# Patient Record
Sex: Male | Born: 1965 | Race: Asian | Hispanic: No | Smoking: Former smoker
Health system: Southern US, Community
[De-identification: ages and names within clinical notes are randomized; demographics above are authoritative.]

## PROBLEM LIST (undated history)

## (undated) DIAGNOSIS — I10 Essential (primary) hypertension: Secondary | ICD-10-CM

## (undated) DIAGNOSIS — E785 Hyperlipidemia, unspecified: Secondary | ICD-10-CM

## (undated) DIAGNOSIS — J45909 Unspecified asthma, uncomplicated: Secondary | ICD-10-CM

## (undated) DIAGNOSIS — N200 Calculus of kidney: Secondary | ICD-10-CM

## (undated) DIAGNOSIS — R062 Wheezing: Secondary | ICD-10-CM

## (undated) DIAGNOSIS — E119 Type 2 diabetes mellitus without complications: Secondary | ICD-10-CM

## (undated) HISTORY — DX: Hyperlipidemia, unspecified: E78.5

## (undated) HISTORY — DX: Type 2 diabetes mellitus without complications: E11.9

## (undated) HISTORY — DX: Unspecified asthma, uncomplicated: J45.909

## (undated) HISTORY — DX: Essential (primary) hypertension: I10

## (undated) HISTORY — PX: CHOLECYSTECTOMY: SHX55

## (undated) HISTORY — PX: EYE SURGERY: SHX253

## (undated) HISTORY — DX: Calculus of kidney: N20.0

## (undated) HISTORY — DX: Wheezing: R06.2

---

## 2017-07-27 DIAGNOSIS — I10 Essential (primary) hypertension: Secondary | ICD-10-CM | POA: Insufficient documentation

## 2017-07-27 DIAGNOSIS — Z87442 Personal history of urinary calculi: Secondary | ICD-10-CM | POA: Insufficient documentation

## 2017-10-14 DIAGNOSIS — J339 Nasal polyp, unspecified: Secondary | ICD-10-CM | POA: Insufficient documentation

## 2020-01-20 DIAGNOSIS — J342 Deviated nasal septum: Secondary | ICD-10-CM | POA: Insufficient documentation

## 2020-08-20 ENCOUNTER — Ambulatory Visit (HOSPITAL_BASED_OUTPATIENT_CLINIC_OR_DEPARTMENT_OTHER)
Admission: RE | Admit: 2020-08-20 | Discharge: 2020-08-20 | Disposition: A | Discharge status: Home / Self Care | Payer: PRIVATE HEALTH INSURANCE | Source: Ambulatory Visit | Attending: Medical | Admitting: Medical

## 2020-08-20 ENCOUNTER — Ambulatory Visit (INDEPENDENT_AMBULATORY_CARE_PROVIDER_SITE_OTHER): Payer: PRIVATE HEALTH INSURANCE | Admitting: Medical

## 2020-08-20 ENCOUNTER — Other Ambulatory Visit: Payer: Self-pay

## 2020-08-20 ENCOUNTER — Encounter: Payer: Self-pay | Admitting: Medical

## 2020-08-20 VITALS — BP 141/76 | HR 80 | Resp 20 | Ht 70.0 in | Wt 186.4 lb

## 2020-08-20 DIAGNOSIS — Z113 Encounter for screening for infections with a predominantly sexual mode of transmission: Secondary | ICD-10-CM

## 2020-08-20 DIAGNOSIS — M545 Low back pain, unspecified: Secondary | ICD-10-CM | POA: Insufficient documentation

## 2020-08-20 DIAGNOSIS — G8929 Other chronic pain: Secondary | ICD-10-CM | POA: Diagnosis present

## 2020-08-20 DIAGNOSIS — Z0000 Encounter for general adult medical examination without abnormal findings: Secondary | ICD-10-CM

## 2020-08-20 DIAGNOSIS — M542 Cervicalgia: Secondary | ICD-10-CM | POA: Insufficient documentation

## 2020-08-20 DIAGNOSIS — I10 Essential (primary) hypertension: Secondary | ICD-10-CM

## 2020-08-20 DIAGNOSIS — Z125 Encounter for screening for malignant neoplasm of prostate: Secondary | ICD-10-CM

## 2020-08-20 DIAGNOSIS — R0981 Nasal congestion: Secondary | ICD-10-CM

## 2020-08-20 NOTE — Progress Notes (Signed)
Subjective:    Patient ID: Darrell Frank, male    DOB: 1966/05/05, 55 y.o.   MRN: [id number]  HPI  Pt in for first time. To get established and decided to go ahead and do wellness exam.  Pt states was with [facility]. 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 Exercise on and off but none recently. When does exercises 3-4 days a week. Non smoker. Stopped 15 years ago. Rare alcohol use.  History of htn. Pt is on lisinopril 10 mg daily.   History of high cholesterol in past per pt. No meds prescribed in the past.  Pt tells me he had colonoscopy about year ago or so. I don't see result in care everywhere?  Pt does not chronic nasal congestion for 2 years or more. Pt states tried steroid infection did not help. He saw one ent and mentioned possible deviation. Mentioned possible surgery. Pt wants to referred to ent again.  Pt also reported some chronic intermittent sharp electric sensation toward rt side of neck/trapezius. Pt notes position related. Occurs for about 6 weeks.    Review of Systems  Constitutional: Negative for chills, fatigue and fever.  HENT: Negative for congestion and ear discharge.   Respiratory: Negative for cough, chest tightness, shortness of breath and wheezing.   Cardiovascular: Negative for chest pain and palpitations.  Gastrointestinal: Negative for abdominal pain.  Endocrine: Positive for polydipsia.  Musculoskeletal: Negative for back pain.  Psychiatric/Behavioral: Negative for behavioral problems, confusion, decreased concentration and dysphoric mood.   No past medical history on file.   Social History   Socioeconomic History   Marital status: Married    Spouse name: Not on file   Number of children: Not on file   Years of education: Not on file   Highest education level: Not on file  Occupational History   Not on file  Tobacco Use   Smoking status: Not on file   Smokeless tobacco: Not on file  Substance and Sexual Activity    Alcohol use: Not on file   Drug use: Not on file   Sexual activity: Not on file  Other Topics Concern   Not on file  Social History Narrative   Not on file   Social Determinants of Health   Financial Resource Strain: Not on file  Food Insecurity: Not on file  Transportation Needs: Not on file  Physical Activity: Not on file  Stress: Not on file  Social Connections: Not on file  Intimate Partner Violence: Not on file     No family history on file.  Not on File  Current Outpatient Medications on File Prior to Visit  Medication Sig Dispense Refill   lisinopril (ZESTRIL) 10 MG tablet Take 10 mg by mouth daily.     Multiple Vitamin (MULTIVITAMIN) capsule Take by mouth.     Omega-3 Fatty Acids (FISH OIL) 1000 MG CAPS Take by mouth.     No current facility-administered medications on file prior to visit.    BP (!) 141/76    Pulse 80    Resp 20    Ht 5\' 10"  (1.778 m)    Wt 186 lb 6.4 oz (84.6 kg)    SpO2 95%    BMI 26.75 kg/m       Objective:   Physical Exam  General Mental Status- Alert. General Appearance- Not in acute distress.   Skin General: Color- Normal Color. Moisture- Normal Moisture.  Neck Carotid Arteries- Normal color. Moisture- Normal Moisture. No carotid bruits.  No JVD.   Chest and Lung Exam Auscultation: Breath Sounds:-Normal.  Cardiovascular Auscultation:Rythm- Regular. Murmurs & Other Heart Sounds:Auscultation of the heart reveals- No Murmurs.  Abdomen Inspection:-Inspeection Normal. Palpation/Percussion:Note:No mass. Palpation and Percussion of the abdomen reveal- Non Tender, Non Distended + BS, no rebound or guarding.   Neurologic Cranial Nerve exam:- CN III-XII intact(No nystagmus), symmetric smile. Strength:- 5/5 equal and symmetric strength both upper and lower extremities.  Back Mid lumbar spine tenderness to palpation. Pain on straight leg lift. Pain on lateral movements and flexion/extension of the spine.  Lower ext  neurologic  L5-S1 sensation intact bilaterally. Normal patellar reflexes bilaterally. No foot drop bilaterally.     Assessment & Plan:  For you wellness exam today I have ordered cbc, cmp, lipid panel  and hiv.  Recommend exercise and healthy diet.  We will let you know lab results as they come in.  Follow up date appointment will be determined after lab review.   For history of chronic nasal congestion placed referral to ENT.  For low back pain history xray of lumbar spine.  For neck area pain with possible radicular pain will get c spine xray.   Your bp is high on check but you report better at home. Want you to check at home and call us or send my chart message. First visit in office and may be white coat bp component.

## 2020-08-20 NOTE — Patient Instructions (Addendum)
For you wellness exam today I have ordered cbc, cmp, lipid panel,psa  and hiv.  Recommend exercise and healthy diet.  We will let you know lab results as they come in.  Follow up date appointment will be determined after lab review.   For history of chronic nasal congestion placed referral to ENT.  For low back pain history xray of lumbar spine.  For neck area pain with possible radicular pain will get c spine xray.   Your bp is high on check but you report better at home. Want you to check at home and call us or send my chart message. First visit in office and may be white coat bp component.   Preventive Care 62-38 Years Old, Male Preventive care refers to lifestyle choices and visits with your health care provider that can promote health and wellness. This includes:  A yearly physical exam. This is also called an annual wellness visit.  Regular dental and eye exams.  Immunizations.  Screening for certain conditions.  Healthy lifestyle choices, such as: ? Eating a healthy diet. ? Getting regular exercise. ? Not using drugs or products that contain nicotine and tobacco. ? Limiting alcohol use. What can I expect for my preventive care visit? Physical exam Your health care provider will check your:  Height and weight. These may be used to calculate your BMI (body mass index). BMI is a measurement that tells if you are at a healthy weight.  Heart rate and blood pressure.  Body temperature.  Skin for abnormal spots. Counseling Your health care provider may ask you questions about your:  Past medical problems.  Family's medical history.  Alcohol, tobacco, and drug use.  Emotional well-being.  Home life and relationship well-being.  Sexual activity.  Diet, exercise, and sleep habits.  Work and work Astronomer.  Access to firearms. What immunizations do I need? Vaccines are usually given at various ages, according to a schedule. Your health care provider  will recommend vaccines for you based on your age, medical history, and lifestyle or other factors, such as travel or where you work.   What tests do I need? Blood tests  Lipid and cholesterol levels. These may be checked every 5 years, or more often if you are over 63 years old.  Hepatitis C test.  Hepatitis B test. Screening  Lung cancer screening. You may have this screening every year starting at age 35 if you have a 30-pack-year history of smoking and currently smoke or have quit within the past 15 years.  Prostate cancer screening. Recommendations will vary depending on your family history and other risks.  Genital exam to check for testicular cancer or hernias.  Colorectal cancer screening. ? All adults should have this screening starting at age 55 and continuing until age 3. ? Your health care provider may recommend screening at age 68 if you are at increased risk. ? You will have tests every 1-10 years, depending on your results and the type of screening test.  Diabetes screening. ? This is done by checking your blood sugar (glucose) after you have not eaten for a while (fasting). ? You may have this done every 1-3 years.  STD (sexually transmitted disease) testing, if you are at risk. Follow these instructions at home: Eating and drinking  Eat a diet that includes fresh fruits and vegetables, whole grains, lean protein, and low-fat dairy products.  Take vitamin and mineral supplements as recommended by your health care provider.  Do not drink alcohol  if your health care provider tells you not to drink.  If you drink alcohol: ? Limit how much you have to 0-2 drinks a day. ? Be aware of how much alcohol is in your drink. In the [place] one drink equals one 12 oz bottle of beer (355 mL), one 5 oz glass of wine (148 mL), or one 1 oz glass of hard liquor (44 mL).   Lifestyle  Take daily care of your teeth and gums. Brush your teeth every morning and night with fluoride  toothpaste. Floss one time each day.  Stay active. Exercise for at least 30 minutes 5 or more days each week.  Do not use any products that contain nicotine or tobacco, such as cigarettes, e-cigarettes, and chewing tobacco. If you need help quitting, ask your health care provider.  Do not use drugs.  If you are sexually active, practice safe sex. Use a condom or other form of protection to prevent STIs (sexually transmitted infections).  If told by your health care provider, take low-dose aspirin daily starting at age 14.  Find healthy ways to cope with stress, such as: ? Meditation, yoga, or listening to music. ? Journaling. ? Talking to a trusted person. ? Spending time with friends and family. Safety  Always wear your seat belt while driving or riding in a vehicle.  Do not drive: ? If you have been drinking alcohol. Do not ride with someone who has been drinking. ? When you are tired or distracted. ? While texting.  Wear a helmet and other protective equipment during sports activities.  If you have firearms in your house, make sure you follow all gun safety procedures. What's next?  Go to your health care provider once a year for an annual wellness visit.  Ask your health care provider how often you should have your eyes and teeth checked.  Stay up to date on all vaccines. This information is not intended to replace advice given to you by your health care provider. Make sure you discuss any questions you have with your health care provider. Document Revised: 03/08/2019 Document Reviewed: 06/03/2018 Elsevier Patient Education  2021 [employer].

## 2020-08-20 NOTE — Addendum Note (Signed)
Addended by: Gwenevere Abbot on: 08/20/2020 09:53 AM   Modules accepted: Orders

## 2020-08-21 ENCOUNTER — Telehealth: Payer: Self-pay | Admitting: Medical

## 2020-08-21 DIAGNOSIS — M542 Cervicalgia: Secondary | ICD-10-CM

## 2020-08-21 NOTE — Telephone Encounter (Signed)
Referral to sports med placed.

## 2020-08-23 ENCOUNTER — Other Ambulatory Visit (INDEPENDENT_AMBULATORY_CARE_PROVIDER_SITE_OTHER): Payer: PRIVATE HEALTH INSURANCE

## 2020-08-23 ENCOUNTER — Encounter: Payer: Self-pay | Admitting: Family Medicine

## 2020-08-23 ENCOUNTER — Other Ambulatory Visit: Payer: Self-pay

## 2020-08-23 ENCOUNTER — Ambulatory Visit (INDEPENDENT_AMBULATORY_CARE_PROVIDER_SITE_OTHER): Payer: PRIVATE HEALTH INSURANCE | Admitting: Family Medicine

## 2020-08-23 VITALS — BP 130/72 | Ht 70.0 in | Wt 175.0 lb

## 2020-08-23 DIAGNOSIS — Z113 Encounter for screening for infections with a predominantly sexual mode of transmission: Secondary | ICD-10-CM | POA: Diagnosis not present

## 2020-08-23 DIAGNOSIS — M5412 Radiculopathy, cervical region: Secondary | ICD-10-CM | POA: Diagnosis not present

## 2020-08-23 DIAGNOSIS — Z125 Encounter for screening for malignant neoplasm of prostate: Secondary | ICD-10-CM | POA: Diagnosis not present

## 2020-08-23 DIAGNOSIS — Z0000 Encounter for general adult medical examination without abnormal findings: Secondary | ICD-10-CM | POA: Diagnosis not present

## 2020-08-23 LAB — LIPID PANEL
Cholesterol: 232 mg/dL — ABNORMAL HIGH (ref 0–200)
HDL: 43.1 mg/dL (ref >39.00)
LDL Cholesterol: 155 mg/dL — ABNORMAL HIGH (ref 0–99)
NonHDL: 188.51
Total CHOL/HDL Ratio: 5
Triglycerides: 166 mg/dL — ABNORMAL HIGH (ref 0.0–149.0)
VLDL: 33.2 mg/dL (ref 0.0–40.0)

## 2020-08-23 LAB — CBC WITH DIFFERENTIAL/PLATELET
Basophils Absolute: 0 10*3/uL (ref 0.0–0.1)
Basophils Relative: 0.6 % (ref 0.0–3.0)
Eosinophils Absolute: 0.3 10*3/uL (ref 0.0–0.7)
Eosinophils Relative: 4.4 % (ref 0.0–5.0)
HCT: 43.3 % (ref 39.0–52.0)
Hemoglobin: 14.8 g/dL (ref 13.0–17.0)
Lymphocytes Relative: 29.5 % (ref 12.0–46.0)
Lymphs Abs: 2.3 10*3/uL (ref 0.7–4.0)
MCHC: 34.2 g/dL (ref 30.0–36.0)
MCV: 87.3 fl (ref 78.0–100.0)
Monocytes Absolute: 0.4 10*3/uL (ref 0.1–1.0)
Monocytes Relative: 4.9 % (ref 3.0–12.0)
Neutro Abs: 4.6 10*3/uL (ref 1.4–7.7)
Neutrophils Relative %: 60.6 % (ref 43.0–77.0)
Platelets: 281 10*3/uL (ref 150.0–400.0)
RBC: 4.96 Mil/uL (ref 4.22–5.81)
RDW: 13.1 % (ref 11.5–15.5)
WBC: 7.6 10*3/uL (ref 4.0–10.5)

## 2020-08-23 LAB — COMPREHENSIVE METABOLIC PANEL
ALT: 23 U/L (ref 0–53)
AST: 19 U/L (ref 0–37)
Albumin: 4.3 g/dL (ref 3.5–5.2)
Alkaline Phosphatase: 68 U/L (ref 39–117)
BUN: 13 mg/dL (ref 6–23)
CO2: 29 mEq/L (ref 19–32)
Calcium: 9.3 mg/dL (ref 8.4–10.5)
Chloride: 102 mEq/L (ref 96–112)
Creatinine, Ser: 0.97 mg/dL (ref 0.40–1.50)
GFR: 89 mL/min (ref >60.00)
Glucose, Bld: 113 mg/dL — ABNORMAL HIGH (ref 70–99)
Potassium: 4.4 mEq/L (ref 3.5–5.1)
Sodium: 136 mEq/L (ref 135–145)
Total Bilirubin: 0.6 mg/dL (ref 0.2–1.2)
Total Protein: 7 g/dL (ref 6.0–8.3)

## 2020-08-23 LAB — PSA: PSA: 0.75 ng/mL (ref 0.10–4.00)

## 2020-08-23 MED ORDER — PREDNISONE 5 MG PO TABS: ORAL_TABLET | ORAL | 0 refills | Status: DC

## 2020-08-23 NOTE — Assessment & Plan Note (Signed)
Symptoms ongoing for 2 months.  Seems to be an impingement related to some of the exercises he was doing. -Counseled on home exercise therapy and supportive care. -Prednisone. -Could consider physical therapy.

## 2020-08-23 NOTE — Patient Instructions (Signed)
Nice to meet you Please try the exercises  Please try the medicine   Please send me a message in MyChart with any questions or updates.  Please see me back in 4 weeks.   --Dr. Zayyan Mullen  

## 2020-08-23 NOTE — Progress Notes (Signed)
  Darrell Frank - 55 y.o. male MRN [id number]  Date of birth: [birth year]  SUBJECTIVE:  Including CC & ROS.  No chief complaint on file.   Darrell Frank is a 55 y.o. male that is presenting with right sided sensational changes in the right side of his neck.  Symptoms been ongoing for about 2 months.  He was being active in the gym and doing several different exercises.  Denies any specific injury or inciting event.  No history of similar pain.  Has not tried anything for the pain.  Seems to be staying the same.  Does not radiate down to his hand..  Independent review of the lumbar spine x-ray from 2/28 shows no significant degenerative changes.  Independent review of the cervical spine x-ray shows endplate degeneration at C6-7.   Review of Systems See HPI   HISTORY: Past Medical, Surgical, Social, and Family History Reviewed & Updated per EMR.   Pertinent Historical Findings include:  Past Medical History:  Diagnosis Date  . Hypertension   . Kidney stone     Past Surgical History:  Procedure Laterality Date  . CHOLECYSTECTOMY      No family history on file.  Social History   Socioeconomic History  . Marital status: Married    Spouse name: Not on file  . Number of children: Not on file  . Years of education: Not on file  . Highest education level: Not on file  Occupational History  . Occupation: Ecologist.  Tobacco Use  . Smoking status: Former Smoker    Packs/day: 0.25    Years: 10.00    Pack years: 2.50    Types: Cigarettes    Quit date: 12/05/2003    Years since quitting: 16.7  . Smokeless tobacco: Never Used  Vaping Use  . Vaping Use: Never used  Substance and Sexual Activity  . Alcohol use: Not on file    Comment: minimal alcohol use/rare.  . Drug use: Never  . Sexual activity: Yes  Other Topics Concern  . Not on file  Social History Narrative  . Not on file   Social Determinants of Health   Financial Resource Strain: Not on file   Food Insecurity: Not on file  Transportation Needs: Not on file  Physical Activity: Not on file  Stress: Not on file  Social Connections: Not on file  Intimate Partner Violence: Not on file     PHYSICAL EXAM:  VS: BP 130/72 (BP Location: Left Arm, Patient Position: Sitting, Cuff Size: Normal)   Ht 5\' 10"  (1.778 m)   Wt 175 lb (79.4 kg)   BMI 25.11 kg/m  Physical Exam Gen: NAD, alert, cooperative with exam, well-appearing MSK:  Neck: Normal range of motion. Normal strength resistance. Normal pincer grasp. Normal grip strength. Normal strength resistance at the wrist and shoulder. Neurovascularly intact   ASSESSMENT & PLAN:   Cervical radiculopathy Symptoms ongoing for 2 months.  Seems to be an impingement related to some of the exercises he was doing. -Counseled on home exercise therapy and supportive care. -Prednisone. -Could consider physical therapy.

## 2020-08-24 ENCOUNTER — Other Ambulatory Visit (INDEPENDENT_AMBULATORY_CARE_PROVIDER_SITE_OTHER): Payer: PRIVATE HEALTH INSURANCE

## 2020-08-24 DIAGNOSIS — R739 Hyperglycemia, unspecified: Secondary | ICD-10-CM | POA: Diagnosis not present

## 2020-08-24 LAB — HEMOGLOBIN A1C: Hgb A1c MFr Bld: 6.3 % (ref 4.6–6.5)

## 2020-08-24 LAB — HIV ANTIBODY (ROUTINE TESTING W REFLEX): HIV 1&2 Ab, 4th Generation: NONREACTIVE

## 2020-08-27 ENCOUNTER — Other Ambulatory Visit: Payer: PRIVATE HEALTH INSURANCE

## 2020-09-07 ENCOUNTER — Encounter (INDEPENDENT_AMBULATORY_CARE_PROVIDER_SITE_OTHER): Payer: Self-pay | Admitting: Otolaryngology

## 2020-09-07 ENCOUNTER — Other Ambulatory Visit: Payer: Self-pay

## 2020-09-07 ENCOUNTER — Ambulatory Visit (INDEPENDENT_AMBULATORY_CARE_PROVIDER_SITE_OTHER): Payer: PRIVATE HEALTH INSURANCE | Admitting: Otolaryngology

## 2020-09-07 VITALS — Temp 96.8°F

## 2020-09-07 DIAGNOSIS — J342 Deviated nasal septum: Secondary | ICD-10-CM | POA: Diagnosis not present

## 2020-09-07 DIAGNOSIS — J310 Chronic rhinitis: Secondary | ICD-10-CM | POA: Diagnosis not present

## 2020-09-07 DIAGNOSIS — J339 Nasal polyp, unspecified: Secondary | ICD-10-CM

## 2020-09-07 NOTE — Progress Notes (Signed)
HPI: Darrell Frank is a 55 y.o. male who presents is referred by his PCP for evaluation of nasal sinus complaints.  He has complained of chronic nasal obstruction as well as decreased sense of smell that he has had for years.  He has been told in [place] where he is from that he had a deviated septum.  He has tried different nasal sprays but does not know the name of the sprays he is used..  Past Medical History:  Diagnosis Date  . Hypertension   . Kidney stone    Past Surgical History:  Procedure Laterality Date  . CHOLECYSTECTOMY     Social History   Socioeconomic History  . Marital status: Married    Spouse name: Not on file  . Number of children: Not on file  . Years of education: Not on file  . Highest education level: Not on file  Occupational History  . Occupation: Ecologist.  Tobacco Use  . Smoking status: Former Smoker    Packs/day: 0.25    Years: 10.00    Pack years: 2.50    Types: Cigarettes    Quit date: 12/05/2003    Years since quitting: 16.7  . Smokeless tobacco: Never Used  Vaping Use  . Vaping Use: Never used  Substance and Sexual Activity  . Alcohol use: Not on file    Comment: minimal alcohol use/rare.  . Drug use: Never  . Sexual activity: Yes  Other Topics Concern  . Not on file  Social History Narrative  . Not on file   Social Determinants of Health   Financial Resource Strain: Not on file  Food Insecurity: Not on file  Transportation Needs: Not on file  Physical Activity: Not on file  Stress: Not on file  Social Connections: Not on file   No family history on file. No Known Allergies Prior to Admission medications   Medication Sig Start Date End Date Taking? Authorizing Provider  lisinopril (ZESTRIL) 10 MG tablet Take 10 mg by mouth daily. 07/13/20   [provider]  Multiple Vitamin (MULTIVITAMIN) capsule Take by mouth.    [provider]  Omega-3 Fatty Acids (FISH OIL) 1000 MG CAPS Take by mouth.     [provider]  predniSONE (DELTASONE) 5 MG tablet Take 6 pills for first day, 5 pills second day, 4 pills third day, 3 pills fourth day, 2 pills the fifth day, and 1 pill sixth day. Patient not taking: Reported on 09/07/2020 08/23/20   Myra Rude, MD     Positive ROS: Otherwise negative  All other systems have been reviewed and were otherwise negative with the exception of those mentioned in the HPI and as above.  Physical Exam: Constitutional: Alert, well-appearing, no acute distress Ears: External ears without lesions or tenderness. Ear canals are clear bilaterally with intact, clear TMs.  Nasal: External nose without lesions. Septum moderately deviated to the right with a narrowed right nasal passageway compared to the left.  Has a septal spur on the right side.  On nasal endoscopy he has polyps on the medial aspect of the middle turbinate.  And some polyps toward the roof of the nasal cavity.  The nasopharynx is clear. Oral: Lips and gums without lesions. Tongue and palate mucosa without lesions. Posterior oropharynx clear. Neck: No palpable adenopathy or masses Respiratory: Breathing comfortably  Skin: No facial/neck lesions or rash noted.  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  Plan: Placed him on Nasacort 2 sprays each nostril at night and recommended regular use of this over the next month.  In addition placed him on a prednisone Dosepak for 6 days starting with 60 mg. We will plan on obtaining a CT scan of the sinuses to better evaluate the extent of the sinonasal polyps. He will follow-up in 1 month for recheck to see how he is done on the nasal steroid spray as well as to review his CT scan.   Narda Bonds, MD   CC:

## 2020-09-11 ENCOUNTER — Other Ambulatory Visit (INDEPENDENT_AMBULATORY_CARE_PROVIDER_SITE_OTHER): Payer: Self-pay

## 2020-09-11 DIAGNOSIS — J329 Chronic sinusitis, unspecified: Secondary | ICD-10-CM

## 2020-09-22 ENCOUNTER — Other Ambulatory Visit: Payer: PRIVATE HEALTH INSURANCE

## 2020-09-24 ENCOUNTER — Other Ambulatory Visit: Payer: Self-pay

## 2020-09-24 ENCOUNTER — Ambulatory Visit (INDEPENDENT_AMBULATORY_CARE_PROVIDER_SITE_OTHER): Payer: PRIVATE HEALTH INSURANCE | Admitting: Family Medicine

## 2020-09-24 DIAGNOSIS — M5412 Radiculopathy, cervical region: Secondary | ICD-10-CM | POA: Diagnosis not present

## 2020-09-24 NOTE — Progress Notes (Signed)
  Darrell Frank - 55 y.o. male MRN [id number]  Date of birth: 1965-10-23  SUBJECTIVE:  Including CC & ROS.  No chief complaint on file.   Darrell Frank is a 55 y.o. male that is following up for his right neck and arm pain.  His pain has gotten improvement.  He still has symptoms occasionally on the right lateral aspect of the trapezius.   Review of Systems See HPI   HISTORY: Past Medical, Surgical, Social, and Family History Reviewed & Updated per EMR.   Pertinent Historical Findings include:  Past Medical History:  Diagnosis Date  . Hypertension   . Kidney stone     Past Surgical History:  Procedure Laterality Date  . CHOLECYSTECTOMY      No family history on file.  Social History   Socioeconomic History  . Marital status: Married    Spouse name: Not on file  . Number of children: Not on file  . Years of education: Not on file  . Highest education level: Not on file  Occupational History  . Occupation: Ecologist.  Tobacco Use  . Smoking status: Former Smoker    Packs/day: 0.25    Years: 10.00    Pack years: 2.50    Types: Cigarettes    Quit date: 12/05/2003    Years since quitting: 16.8  . Smokeless tobacco: Never Used  Vaping Use  . Vaping Use: Never used  Substance and Sexual Activity  . Alcohol use: Not on file    Comment: minimal alcohol use/rare.  . Drug use: Never  . Sexual activity: Yes  Other Topics Concern  . Not on file  Social History Narrative  . Not on file   Social Determinants of Health   Financial Resource Strain: Not on file  Food Insecurity: Not on file  Transportation Needs: Not on file  Physical Activity: Not on file  Stress: Not on file  Social Connections: Not on file  Intimate Partner Violence: Not on file     PHYSICAL EXAM:  VS: Ht 5\' 10"  (1.778 m)   Wt 175 lb (79.4 kg)   BMI 25.11 kg/m  Physical Exam Gen: NAD, alert, cooperative with exam, well-appearing     ASSESSMENT & PLAN:   Cervical  radiculopathy Only has intermittent pain.  Pain has been occurring for 3 to 4 months. -Counseled on home exercise therapy and supportive care. -Could consider physical therapy or further imaging. -He will call if symptoms are ongoing for next step.

## 2020-09-24 NOTE — Assessment & Plan Note (Signed)
Only has intermittent pain.  Pain has been occurring for 3 to 4 months. -Counseled on home exercise therapy and supportive care. -Could consider physical therapy or further imaging. -He will call if symptoms are ongoing for next step.

## 2020-10-04 ENCOUNTER — Other Ambulatory Visit: Payer: Self-pay

## 2020-10-04 ENCOUNTER — Ambulatory Visit
Admission: RE | Admit: 2020-10-04 | Discharge: 2020-10-04 | Disposition: A | Discharge status: Home / Self Care | Payer: PRIVATE HEALTH INSURANCE | Source: Ambulatory Visit | Attending: Otolaryngology | Admitting: Otolaryngology

## 2020-10-04 DIAGNOSIS — J329 Chronic sinusitis, unspecified: Secondary | ICD-10-CM

## 2020-10-05 ENCOUNTER — Ambulatory Visit (INDEPENDENT_AMBULATORY_CARE_PROVIDER_SITE_OTHER): Payer: PRIVATE HEALTH INSURANCE | Admitting: Otolaryngology

## 2020-10-05 ENCOUNTER — Encounter (INDEPENDENT_AMBULATORY_CARE_PROVIDER_SITE_OTHER): Payer: Self-pay | Admitting: Otolaryngology

## 2020-10-05 ENCOUNTER — Other Ambulatory Visit: Payer: Self-pay

## 2020-10-05 VITALS — Temp 96.1°F

## 2020-10-05 DIAGNOSIS — J320 Chronic maxillary sinusitis: Secondary | ICD-10-CM

## 2020-10-05 DIAGNOSIS — J342 Deviated nasal septum: Secondary | ICD-10-CM

## 2020-10-05 DIAGNOSIS — J322 Chronic ethmoidal sinusitis: Secondary | ICD-10-CM

## 2020-10-05 DIAGNOSIS — J343 Hypertrophy of nasal turbinates: Secondary | ICD-10-CM

## 2020-10-05 NOTE — Progress Notes (Signed)
HPI: Darrell Frank is a 55 y.o. male who returns today for evaluation of chronic nasal obstruction.  He has previously used nasal steroid sprays.  He has been on antibiotics for sinus infections.  He returns today following a CT scan of his sinuses to discuss possible surgical options. He has also complained of chronic decreased sense of smell.  I discussed with him that surgery will improve his nasal airway and breathing but may not improve his smell. I reviewed the CT scan with the patient in the office today and this revealed a rather severe septal deformity to the right with a large right septal spur.  He had moderate sinus disease within the [facility] regions and ethmoid.  He had a little bit of thickening within the right nasofrontal duct area but this was minimal and the frontal sinuses were relatively clear.Marland Kitchen His main complaint seems to be more nasal obstruction and difficulty breathing through his nose especially on the right side.  He also snores and questions whether this will help with snoring.  Past Medical History:  Diagnosis Date  . Hypertension   . Kidney stone    Past Surgical History:  Procedure Laterality Date  . CHOLECYSTECTOMY     Social History   Socioeconomic History  . Marital status: Married    Spouse name: Not on file  . Number of children: Not on file  . Years of education: Not on file  . Highest education level: Not on file  Occupational History  . Occupation: Ecologist.  Tobacco Use  . Smoking status: Former Smoker    Packs/day: 0.25    Years: 10.00    Pack years: 2.50    Types: Cigarettes    Quit date: 12/05/2003    Years since quitting: 16.8  . Smokeless tobacco: Never Used  Vaping Use  . Vaping Use: Never used  Substance and Sexual Activity  . Alcohol use: Not on file    Comment: minimal alcohol use/rare.  . Drug use: Never  . Sexual activity: Yes  Other Topics Concern  . Not on file  Social History Narrative  . Not on file    Social Determinants of Health   Financial Resource Strain: Not on file  Food Insecurity: Not on file  Transportation Needs: Not on file  Physical Activity: Not on file  Stress: Not on file  Social Connections: Not on file   No family history on file. No Known Allergies Prior to Admission medications   Medication Sig Start Date End Date Taking? Authorizing Provider  lisinopril (ZESTRIL) 10 MG tablet Take 10 mg by mouth daily. 07/13/20   [provider]  Multiple Vitamin (MULTIVITAMIN) capsule Take by mouth.    [provider]  Omega-3 Fatty Acids (FISH OIL) 1000 MG CAPS Take by mouth.    [provider]  predniSONE (DELTASONE) 5 MG tablet Take 6 pills for first day, 5 pills second day, 4 pills third day, 3 pills fourth day, 2 pills the fifth day, and 1 pill sixth day. Patient not taking: Reported on 09/07/2020 08/23/20   Myra Rude, MD     Positive ROS: Otherwise negative  All other systems have been reviewed and were otherwise negative with the exception of those mentioned in the HPI and as above.  Physical Exam: Constitutional: Alert, well-appearing, no acute distress Ears: External ears without lesions or tenderness. Ear canals are clear bilaterally with intact, clear TMs.  Nasal: External nose without lesions. Septum severely deviated to  the right.  No obvious mucopurulent discharge from the middle meatus.  No polyps noted..  Oral: Lips and gums without lesions. Tongue and palate mucosa without lesions. Posterior oropharynx clear. Neck: No palpable adenopathy or masses Lungs clear to auscultation Respiratory: Breathing comfortably Cardiac exam with regular rate and rhythm no murmur. Skin: No facial/neck lesions or rash noted.  Procedures  Assessment: Severe septal deformity with nasal obstruction.  Turbinate hypertrophy Chronic sinus disease.   Plan: Reviewed with the patient concerning septoplasty turbinate reductions along with  limited FESS.  He will also need the left middle turbinate reduced in order to straighten the septum to the left adequately. Reviewed the surgery with the patient and discussed with him that he will have to have packing overnight and will need to be out of work for 3 to 4 days. We can schedule this at his convenience.  He will call us back to schedule surgery.   Narda Bonds, MD

## 2021-05-07 ENCOUNTER — Ambulatory Visit (HOSPITAL_BASED_OUTPATIENT_CLINIC_OR_DEPARTMENT_OTHER)
Admission: RE | Admit: 2021-05-07 | Discharge: 2021-05-07 | Disposition: A | Discharge status: Home / Self Care | Payer: PRIVATE HEALTH INSURANCE | Source: Ambulatory Visit | Attending: Medical | Admitting: Medical

## 2021-05-07 ENCOUNTER — Encounter: Payer: Self-pay | Admitting: Medical

## 2021-05-07 ENCOUNTER — Ambulatory Visit (INDEPENDENT_AMBULATORY_CARE_PROVIDER_SITE_OTHER): Payer: PRIVATE HEALTH INSURANCE | Admitting: Medical

## 2021-05-07 ENCOUNTER — Other Ambulatory Visit: Payer: Self-pay

## 2021-05-07 VITALS — BP 138/78 | HR 73 | Resp 18 | Ht 70.0 in | Wt 184.0 lb

## 2021-05-07 DIAGNOSIS — R062 Wheezing: Secondary | ICD-10-CM | POA: Diagnosis present

## 2021-05-07 DIAGNOSIS — K219 Gastro-esophageal reflux disease without esophagitis: Secondary | ICD-10-CM | POA: Diagnosis not present

## 2021-05-07 DIAGNOSIS — I10 Essential (primary) hypertension: Secondary | ICD-10-CM | POA: Diagnosis not present

## 2021-05-07 MED ORDER — ALBUTEROL SULFATE HFA 108 (90 BASE) MCG/ACT IN AERS: 2.0000 | INHALATION_SPRAY | Freq: Four times a day (QID) | RESPIRATORY_TRACT | 0 refills | Status: DC | PRN

## 2021-05-07 MED ORDER — BUDESONIDE-FORMOTEROL FUMARATE 160-4.5 MCG/ACT IN AERO: 2.0000 | INHALATION_SPRAY | Freq: Two times a day (BID) | RESPIRATORY_TRACT | 3 refills | Status: DC

## 2021-05-07 MED ORDER — FAMOTIDINE 20 MG PO TABS: 20.0000 mg | ORAL_TABLET | Freq: Every day | ORAL | 3 refills | Status: DC

## 2021-05-07 NOTE — Patient Instructions (Addendum)
Recent wheezing sporadically worse past 2 weeks but some very mild over past 4-5 months. Will get chest xray today. Prescribe symbicort inhaler to use 2 inhalation twice daily. Can use albuterol if needed as discussed.  Recent gerd/reflux symptoms. Wheezing/asthma can be associated. Eat healthy as discussed. Rx famotadine/pepcid.  Bp better on recheck. Continue lisinopril. Check bp daily at home and send me my chart update in one week.  Follow up in 3 weeks or sooner if needed.  Gastroesophageal Reflux Disease, Adult Gastroesophageal reflux (GER) happens when acid from the stomach flows up into the tube that connects the mouth and the stomach (esophagus). Normally, food travels down the esophagus and stays in the stomach to be digested. With GER, food and stomach acid sometimes move back up into the esophagus. You may have a disease called gastroesophageal reflux disease (GERD) if the reflux: Happens often. Causes frequent or very bad symptoms. Causes problems such as damage to the esophagus. When this happens, the esophagus becomes sore and swollen. Over time, GERD can make small holes (ulcers) in the lining of the esophagus. What are the causes? This condition is caused by a problem with the muscle between the esophagus and the stomach. When this muscle is weak or not normal, it does not close properly to keep food and acid from coming back up from the stomach. The muscle can be weak because of: Tobacco use. Pregnancy. Having a certain type of hernia (hiatal hernia). Alcohol use. Certain foods and drinks, such as coffee, chocolate, onions, and peppermint. What increases the risk? Being overweight. Having a disease that affects your connective tissue. Taking NSAIDs, such a ibuprofen. What are the signs or symptoms? Heartburn. Difficult or painful swallowing. The feeling of having a lump in the throat. A bitter taste in the mouth. Bad breath. Having a lot of saliva. Having an upset  or bloated stomach. Burping. Chest pain. Different conditions can cause chest pain. Make sure you see your doctor if you have chest pain. Shortness of breath or wheezing. A long-term cough or a cough at night. Wearing away of the surface of teeth (tooth enamel). Weight loss. How is this treated? Making changes to your diet. Taking medicine. Having surgery. Treatment will depend on how bad your symptoms are. Follow these instructions at home: Eating and drinking  Follow a diet as told by your doctor. You may need to avoid foods and drinks such as: Coffee and tea, with or without caffeine. Drinks that contain alcohol. Energy drinks and sports drinks. Bubbly (carbonated) drinks or sodas. Chocolate and cocoa. Peppermint and mint flavorings. Garlic and onions. Horseradish. Spicy and acidic foods. These include peppers, chili powder, curry powder, vinegar, hot sauces, and BBQ sauce. Citrus fruit juices and citrus fruits, such as oranges, lemons, and limes. Tomato-based foods. These include red sauce, chili, salsa, and pizza with red sauce. Fried and fatty foods. These include donuts, french fries, potato chips, and high-fat dressings. High-fat meats. These include hot dogs, rib eye steak, sausage, ham, and bacon. High-fat dairy items, such as whole milk, butter, and cream cheese. Eat small meals often. Avoid eating large meals. Avoid drinking large amounts of liquid with your meals. Avoid eating meals during the 2-3 hours before bedtime. Avoid lying down right after you eat. Do not exercise right after you eat. Lifestyle  Do not smoke or use any products that contain nicotine or tobacco. If you need help quitting, ask your doctor. Try to lower your stress. If you need help doing this,  ask your doctor. If you are overweight, lose an amount of weight that is healthy for you. Ask your doctor about a safe weight loss goal. General instructions Pay attention to any changes in your  symptoms. Take over-the-counter and prescription medicines only as told by your doctor. Do not take aspirin, ibuprofen, or other NSAIDs unless your doctor says it is okay. Wear loose clothes. Do not wear anything tight around your waist. Raise (elevate) the head of your bed about 6 inches (15 cm). You may need to use a wedge to do this. Avoid bending over if this makes your symptoms worse. Keep all follow-up visits. Contact a doctor if: You have new symptoms. You lose weight and you do not know why. You have trouble swallowing or it hurts to swallow. You have wheezing or a cough that keeps happening. You have a hoarse voice. Your symptoms do not get better with treatment. Get help right away if: You have sudden pain in your arms, neck, jaw, teeth, or back. You suddenly feel sweaty, dizzy, or light-headed. You have chest pain or shortness of breath. You vomit and the vomit is green, yellow, or black, or it looks like blood or coffee grounds. You faint. Your poop (stool) is red, bloody, or black. You cannot swallow, drink, or eat. These symptoms may represent a serious problem that is an emergency. Do not wait to see if the symptoms will go away. Get medical help right away. Call your local emergency services (911 in the [place] Do not drive yourself to the hospital. Summary If a person has gastroesophageal reflux disease (GERD), food and stomach acid move back up into the esophagus and cause symptoms or problems such as damage to the esophagus. Treatment will depend on how bad your symptoms are. Follow a diet as told by your doctor. Take all medicines only as told by your doctor. This information is not intended to replace advice given to you by your health care provider. Make sure you discuss any questions you have with your health care provider. Document Revised: 12/19/2019 Document Reviewed: 12/19/2019 Elsevier Patient Education  2022 [employer].

## 2021-05-07 NOTE — Progress Notes (Signed)
Subjective:    Patient ID: Darrell Frank, male    DOB: 1965-11-07, 55 y.o.   MRN: [id number]  HPI  Pt in for some recent mild occasional wheezing. He states allergies to cats and he has 2 cats at home. Sometimes will wheeze randomly.  In the past he would use albuterol inhaler and respond quickly.  However 2 weeks wheezed more constantly for one day. Then wheezing went away. But then twice just last 3-4 days after earing fajita felt liung tigthness and wheezing.  Pt states some relux occurred before wheezing last 4 days.   Also 2 weeks ago had heart burn associate with wheezing.  He has used old expired ventolin inhaler. Pt states he willl notice mild wheeze when he lays down at nght.  Htn- bp high initially but better on recheck.   Review of Systems  Constitutional:  Negative for chills, fatigue and fever.  HENT:  Negative for congestion, drooling, ear discharge and ear pain.   Respiratory:  Negative for cough, chest tightness, shortness of breath and wheezing.   Cardiovascular:  Negative for chest pain and palpitations.  Gastrointestinal:  Negative for abdominal distention, abdominal pain, diarrhea, nausea and rectal pain.  Genitourinary:  Negative for decreased urine volume, hematuria, penile pain, penile swelling and testicular pain.  Musculoskeletal:  Negative for back pain, gait problem, joint swelling and myalgias.    Past Medical History:  Diagnosis Date   Hypertension    Kidney stone      Social History   Socioeconomic History   Marital status: Married    Spouse name: Not on file   Number of children: Not on file   Years of education: Not on file   Highest education level: Not on file  Occupational History   Occupation: Ecologist.  Tobacco Use   Smoking status: Former    Packs/day: 0.25    Years: 10.00    Pack years: 2.50    Types: Cigarettes    Quit date: 12/05/2003    Years since quitting: 17.4   Smokeless tobacco: Never  Vaping Use    Vaping Use: Never used  Substance and Sexual Activity   Alcohol use: Not on file    Comment: minimal alcohol use/rare.   Drug use: Never   Sexual activity: Yes  Other Topics Concern   Not on file  Social History Narrative   Not on file   Social Determinants of Health   Financial Resource Strain: Not on file  Food Insecurity: Not on file  Transportation Needs: Not on file  Physical Activity: Not on file  Stress: Not on file  Social Connections: Not on file  Intimate Partner Violence: Not on file    Past Surgical History:  Procedure Laterality Date   CHOLECYSTECTOMY      No family history on file.  No Known Allergies  Current Outpatient Medications on File Prior to Visit  Medication Sig Dispense Refill   lisinopril (ZESTRIL) 10 MG tablet Take 10 mg by mouth daily.     Multiple Vitamin (MULTIVITAMIN) capsule Take by mouth.     Omega-3 Fatty Acids (FISH OIL) 1000 MG CAPS Take by mouth.     No current facility-administered medications on file prior to visit.    BP (!) 154/100   Pulse 73   Resp 18   Ht 5\' 10"  (1.778 m)   Wt 184 lb (83.5 kg)   SpO2 100%   BMI 26.40 kg/m  Objective:   Physical Exam  General Mental Status- Alert. General Appearance- Not in acute distress.   Skin General: Color- Normal Color. Moisture- Normal Moisture.  Neck Carotid Arteries- Normal color. Moisture- Normal Moisture. No carotid bruits. No JVD.  Chest and Lung Exam Auscultation: Breath Sounds:-Normal.  Cardiovascular Auscultation:Rythm- Regular. Murmurs & Other Heart Sounds:Auscultation of the heart reveals- No Murmurs.  Abdomen Inspection:-Inspeection Normal. Palpation/Percussion:Note:No mass. Palpation and Percussion of the abdomen reveal- Non Tender, Non Distended + BS, no rebound or guarding.  Neurologic Cranial Nerve exam:- CN III-XII intact(No nystagmus), symmetric smile. Strength:- 5/5 equal and symmetric strength both upper and lower extremities.        Assessment & Plan:   Patient Instructions  Recent wheezing sporadically worse past 2 weeks but some very mild over past 4-5 months. Will get chest xray today. Prescribe symbicort ihaler to use 2 inhalation twice daily. Can use albuterol if needed as discussed.  Recent gerd/reflux symptoms. Wheezing/asthma can be associated. Eat healthy as discussed. Rx famotadine/pepcid.  Bp better on recheck. Continue lisinopril. Check bp daily at home and send me my chart update in one week.  Follow up in 3 weeks or sooner if needed.

## 2021-11-19 ENCOUNTER — Encounter: Payer: PRIVATE HEALTH INSURANCE | Admitting: Medical

## 2022-01-03 ENCOUNTER — Ambulatory Visit (INDEPENDENT_AMBULATORY_CARE_PROVIDER_SITE_OTHER): Payer: PRIVATE HEALTH INSURANCE | Admitting: Medical

## 2022-01-03 VITALS — BP 115/87 | HR 96 | Resp 18 | Ht 70.0 in | Wt 182.0 lb

## 2022-01-03 DIAGNOSIS — R6882 Decreased libido: Secondary | ICD-10-CM | POA: Diagnosis not present

## 2022-01-03 DIAGNOSIS — Z125 Encounter for screening for malignant neoplasm of prostate: Secondary | ICD-10-CM

## 2022-01-03 DIAGNOSIS — I10 Essential (primary) hypertension: Secondary | ICD-10-CM

## 2022-01-03 DIAGNOSIS — Z0000 Encounter for general adult medical examination without abnormal findings: Secondary | ICD-10-CM

## 2022-01-03 DIAGNOSIS — R5383 Other fatigue: Secondary | ICD-10-CM | POA: Diagnosis not present

## 2022-01-03 DIAGNOSIS — R7989 Other specified abnormal findings of blood chemistry: Secondary | ICD-10-CM

## 2022-01-03 DIAGNOSIS — Z1211 Encounter for screening for malignant neoplasm of colon: Secondary | ICD-10-CM | POA: Diagnosis not present

## 2022-01-03 DIAGNOSIS — E119 Type 2 diabetes mellitus without complications: Secondary | ICD-10-CM

## 2022-01-03 DIAGNOSIS — R0789 Other chest pain: Secondary | ICD-10-CM | POA: Diagnosis not present

## 2022-01-03 DIAGNOSIS — R739 Hyperglycemia, unspecified: Secondary | ICD-10-CM | POA: Diagnosis not present

## 2022-01-03 LAB — CBC WITH DIFFERENTIAL/PLATELET
Basophils Absolute: 0 10*3/uL (ref 0.0–0.1)
Basophils Relative: 0.4 % (ref 0.0–3.0)
Eosinophils Absolute: 0.3 10*3/uL (ref 0.0–0.7)
Eosinophils Relative: 2.4 % (ref 0.0–5.0)
HCT: 43 % (ref 39.0–52.0)
Hemoglobin: 14.2 g/dL (ref 13.0–17.0)
Lymphocytes Relative: 16.9 % (ref 12.0–46.0)
Lymphs Abs: 1.9 10*3/uL (ref 0.7–4.0)
MCHC: 33 g/dL (ref 30.0–36.0)
MCV: 88.7 fl (ref 78.0–100.0)
Monocytes Absolute: 0.5 10*3/uL (ref 0.1–1.0)
Monocytes Relative: 4.2 % (ref 3.0–12.0)
Neutro Abs: 8.5 10*3/uL — ABNORMAL HIGH (ref 1.4–7.7)
Neutrophils Relative %: 76.1 % (ref 43.0–77.0)
Platelets: 289 10*3/uL (ref 150.0–400.0)
RBC: 4.85 Mil/uL (ref 4.22–5.81)
RDW: 13.5 % (ref 11.5–15.5)
WBC: 11.2 10*3/uL — ABNORMAL HIGH (ref 4.0–10.5)

## 2022-01-03 LAB — COMPREHENSIVE METABOLIC PANEL
ALT: 42 U/L (ref 0–53)
AST: 32 U/L (ref 0–37)
Albumin: 4.5 g/dL (ref 3.5–5.2)
Alkaline Phosphatase: 67 U/L (ref 39–117)
BUN: 14 mg/dL (ref 6–23)
CO2: 28 mEq/L (ref 19–32)
Calcium: 9.1 mg/dL (ref 8.4–10.5)
Chloride: 102 mEq/L (ref 96–112)
Creatinine, Ser: 0.99 mg/dL (ref 0.40–1.50)
GFR: 86.02 mL/min (ref >60.00)
Glucose, Bld: 115 mg/dL — ABNORMAL HIGH (ref 70–99)
Potassium: 4.1 mEq/L (ref 3.5–5.1)
Sodium: 137 mEq/L (ref 135–145)
Total Bilirubin: 0.7 mg/dL (ref 0.2–1.2)
Total Protein: 6.9 g/dL (ref 6.0–8.3)

## 2022-01-03 LAB — LIPID PANEL
Cholesterol: 215 mg/dL — ABNORMAL HIGH (ref 0–200)
HDL: 34.5 mg/dL — ABNORMAL LOW (ref >39.00)
LDL Cholesterol: 156 mg/dL — ABNORMAL HIGH (ref 0–99)
NonHDL: 180.73
Total CHOL/HDL Ratio: 6
Triglycerides: 126 mg/dL (ref 0.0–149.0)
VLDL: 25.2 mg/dL (ref 0.0–40.0)

## 2022-01-03 LAB — VITAMIN B12: Vitamin B-12: 292 pg/mL (ref 211–911)

## 2022-01-03 LAB — PSA: PSA: 0.37 ng/mL (ref 0.10–4.00)

## 2022-01-03 LAB — TSH: TSH: 1.03 u[IU]/mL (ref 0.35–5.50)

## 2022-01-03 LAB — T4, FREE: Free T4: 0.93 ng/dL (ref 0.60–1.60)

## 2022-01-03 LAB — HEMOGLOBIN A1C: Hgb A1c MFr Bld: 6.9 % — ABNORMAL HIGH (ref 4.6–6.5)

## 2022-01-03 MED ORDER — LISINOPRIL 10 MG PO TABS: 10.0000 mg | ORAL_TABLET | Freq: Every day | ORAL | 11 refills | Status: DC

## 2022-01-03 NOTE — Patient Instructions (Addendum)
For you wellness exam today I have ordered cbc, cmp and  lipid panel.  For fatigue will add tsh, t4, b12 and b1 level.  Declines shingles vaccine.  GI MD referral placed for screening colonoscopy.  Recommend exercise and healthy diet.  We will let you know lab results as they come in.  Follow up date appointment will be determined after lab review.    For atypical chest pain we did ekg today. Will refer to cardiologist for evaluation and treatment. If during interim you have recurrent chest pain be seen in the ED.  Ekg sinus rhythm. Machine read consider old anterior infarct. No prior ekg to compare.  For low libido and fatigue placed testosterone panel.   Preventive Care 26-69 Years Old, Male Preventive care refers to lifestyle choices and visits with your health care provider that can promote health and wellness. Preventive care visits are also called wellness exams. What can I expect for my preventive care visit? Counseling During your preventive care visit, your health care provider may ask about your: Medical history, including: Past medical problems. Family medical history. Current health, including: Emotional well-being. Home life and relationship well-being. Sexual activity. Lifestyle, including: Alcohol, nicotine or tobacco, and drug use. Access to firearms. Diet, exercise, and sleep habits. Safety issues such as seatbelt and bike helmet use. Sunscreen use. Work and work Astronomer. Physical exam Your health care provider will check your: Height and weight. These may be used to calculate your BMI (body mass index). BMI is a measurement that tells if you are at a healthy weight. Waist circumference. This measures the distance around your waistline. This measurement also tells if you are at a healthy weight and may help predict your risk of certain diseases, such as type 2 diabetes and high blood pressure. Heart rate and blood pressure. Body temperature. Skin for  abnormal spots. What immunizations do I need?  Vaccines are usually given at various ages, according to a schedule. Your health care provider will recommend vaccines for you based on your age, medical history, and lifestyle or other factors, such as travel or where you work. What tests do I need? Screening Your health care provider may recommend screening tests for certain conditions. This may include: Lipid and cholesterol levels. Diabetes screening. This is done by checking your blood sugar (glucose) after you have not eaten for a while (fasting). Hepatitis B test. Hepatitis C test. HIV (human immunodeficiency virus) test. STI (sexually transmitted infection) testing, if you are at risk. Lung cancer screening. Prostate cancer screening. Colorectal cancer screening. Talk with your health care provider about your test results, treatment options, and if necessary, the need for more tests. Follow these instructions at home: Eating and drinking  Eat a diet that includes fresh fruits and vegetables, whole grains, lean protein, and low-fat dairy products. Take vitamin and mineral supplements as recommended by your health care provider. Do not drink alcohol if your health care provider tells you not to drink. If you drink alcohol: Limit how much you have to 0-2 drinks a day. Know how much alcohol is in your drink. In the [place] one drink equals one 12 oz bottle of beer (355 mL), one 5 oz glass of wine (148 mL), or one 1 oz glass of hard liquor (44 mL). Lifestyle Brush your teeth every morning and night with fluoride toothpaste. Floss one time each day. Exercise for at least 30 minutes 5 or more days each week. Do not use any products that contain nicotine or  tobacco. These products include cigarettes, chewing tobacco, and vaping devices, such as e-cigarettes. If you need help quitting, ask your health care provider. Do not use drugs. If you are sexually active, practice safe sex. Use a  condom or other form of protection to prevent STIs. Take aspirin only as told by your health care provider. Make sure that you understand how much to take and what form to take. Work with your health care provider to find out whether it is safe and beneficial for you to take aspirin daily. Find healthy ways to manage stress, such as: Meditation, yoga, or listening to music. Journaling. Talking to a trusted person. Spending time with friends and family. Minimize exposure to UV radiation to reduce your risk of skin cancer. Safety Always wear your seat belt while driving or riding in a vehicle. Do not drive: If you have been drinking alcohol. Do not ride with someone who has been drinking. When you are tired or distracted. While texting. If you have been using any mind-altering substances or drugs. Wear a helmet and other protective equipment during sports activities. If you have firearms in your house, make sure you follow all gun safety procedures. What's next? Go to your health care provider once a year for an annual wellness visit. Ask your health care provider how often you should have your eyes and teeth checked. Stay up to date on all vaccines. This information is not intended to replace advice given to you by your health care provider. Make sure you discuss any questions you have with your health care provider. Document Revised: 12/05/2020 Document Reviewed: 12/05/2020 Elsevier Patient Education  2023 [employer].

## 2022-01-03 NOTE — Progress Notes (Signed)
Subjective:    Patient ID: Darrell Frank, male    DOB: 25-May-1966, 56 y.o.   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 Exercise on and off but none recently. When does exercises 3-4 days a week. Non smoker. Stopped 15 years ago. Rare alcohol use.   Pt states had colonoscopy. Today states done 4-5 years ago. He thinks was told had polyps but not sure.  Htn- pt is on lisinpril 10 mg daily. He states he gets med from [place].  Pt states past 6 months he is feeling tired/fatigue.   Pt states 2.5 months ago had some throbbing chest pain on and off for about a month. He states would last briefly for 30 seconds mild tight sensation. No arm pain, no jaw pain or shortness of breath at that time. No symptoms over past month.  Pt states friend of his died from heart attack. So pt is concerned now.  Review of Systems  Constitutional:  Positive for fatigue. Negative for chills and fever.  HENT:  Negative for congestion, ear discharge and ear pain.   Respiratory:  Negative for cough, choking, shortness of breath and wheezing.   Cardiovascular:  Negative for chest pain and palpitations.       None presently but see hpi.  Gastrointestinal:  Negative for abdominal pain, nausea and vomiting.  Genitourinary:  Negative for dysuria, flank pain and frequency.       Low libido.  Musculoskeletal:  Negative for back pain and neck pain.  Neurological:  Negative for dizziness, syncope, weakness, light-headedness and numbness.  Hematological:  Negative for adenopathy.  Psychiatric/Behavioral:  Negative for confusion.     Past Medical History:  Diagnosis Date   Hypertension    Kidney stone      Social History   Socioeconomic History   Marital status: Married    Spouse name: Not on file   Number of children: Not on file   Years of education: Not on file   Highest education level: Not on file  Occupational History   Occupation: Ecologist.   Tobacco Use   Smoking status: Former    Packs/day: 0.25    Years: 10.00    Total pack years: 2.50    Types: Cigarettes    Quit date: 12/05/2003    Years since quitting: 18.0   Smokeless tobacco: Never  Vaping Use   Vaping Use: Never used  Substance and Sexual Activity   Alcohol use: Not on file    Comment: minimal alcohol use/rare.   Drug use: Never   Sexual activity: Yes  Other Topics Concern   Not on file  Social History Narrative   Not on file   Social Determinants of Health   Financial Resource Strain: Not on file  Food Insecurity: Not on file  Transportation Needs: Not on file  Physical Activity: Not on file  Stress: Not on file  Social Connections: Not on file  Intimate Partner Violence: Not on file    Past Surgical History:  Procedure Laterality Date   CHOLECYSTECTOMY      No family history on file.  No Known Allergies  Current Outpatient Medications on File Prior to Visit  Medication Sig Dispense Refill   Multiple Vitamin (MULTIVITAMIN) capsule Take by mouth.     Omega-3 Fatty Acids (FISH OIL) 1000 MG CAPS Take by mouth.     No current facility-administered medications on file prior to visit.    BP 115/87  Pulse 96   Resp 18   Ht 5\' 10"  (1.778 m)   Wt 182 lb (82.6 kg)   SpO2 96%   BMI 26.11 kg/m        Objective:   Physical Exam  General Mental Status- Alert. General Appearance- Not in acute distress.   Skin General: Color- Normal Color. Moisture- Normal Moisture.  Neck Carotid Arteries- Normal color. Moisture- Normal Moisture. No carotid bruits. No JVD.  Chest and Lung Exam Auscultation: Breath Sounds:-Normal.  Cardiovascular Auscultation:Rythm- Regular. Murmurs & Other Heart Sounds:Auscultation of the heart reveals- No Murmurs.  Abdomen Inspection:-Inspeection Normal. Palpation/Percussion:Note:No mass. Palpation and Percussion of the abdomen reveal- Non Tender, Non Distended + BS, no rebound or  guarding.   Neurologic Cranial Nerve exam:- CN III-XII intact(No nystagmus), symmetric smile. Strength:- 5/5 equal and symmetric strength both upper and lower extremities.       Assessment & Plan:   Patient Instructions  For you wellness exam today I have ordered cbc, cmp and  lipid panel.  For fatigue will add tsh, t4, b12 and b1 level.  Declines shingles vaccine.  GI MD referral placed for screening colonoscopy.  Recommend exercise and healthy diet.  We will let you know lab results as they come in.  Follow up date appointment will be determined after lab review.    For atypical chest pain we did ekg today. Will refer to cardiologist for evaluation and treatment. If during interim you have recurrent chest pain be seen in the ED.  For low libido and fatigue placed testosterone panel.       , Esperanza Richters    [place] charge in addition to wellness exam. Addressed fatigue, chest pain and low libido(possible low testosterone). Also placed referral to cardiologist.

## 2022-01-04 LAB — TESTOSTERONE TOTAL,FREE,BIO, MALES
Albumin: 4.3 g/dL (ref 3.6–5.1)
Sex Hormone Binding: 10 nmol/L (ref 10–50)
Testosterone: 245 ng/dL — ABNORMAL LOW (ref 250–827)

## 2022-01-04 MED ORDER — METFORMIN HCL 500 MG PO TABS: ORAL_TABLET | ORAL | 0 refills | Status: DC

## 2022-01-04 MED ORDER — ATORVASTATIN CALCIUM 10 MG PO TABS: 10.0000 mg | ORAL_TABLET | Freq: Every day | ORAL | 3 refills | Status: DC

## 2022-01-04 NOTE — Addendum Note (Signed)
Addended by: Gwenevere Abbot on: 01/04/2022 09:21 AM   Modules accepted: Orders

## 2022-01-04 NOTE — Addendum Note (Signed)
Addended by: Gwenevere Abbot on: 01/04/2022 09:19 AM   Modules accepted: Orders

## 2022-01-08 LAB — VITAMIN B1: Vitamin B1 (Thiamine): <6 nmol/L — ABNORMAL LOW (ref 8–30)

## 2022-01-26 ENCOUNTER — Encounter: Payer: Self-pay | Admitting: Cardiovascular Disease

## 2022-01-26 NOTE — Progress Notes (Signed)
Cardiology Office Note:    Date:  01/28/2022   ID:  Darrell Frank, DOB Nov 01, 1965, MRN [id number]  PCP:  Marisue Brooklyn   Darrell Frank Cardiologist:  new to [name]  Click to update primary MD,subspecialty MD or APP then REFRESH:1}    Referring MD: Esperanza Richters, PA-C   Chief Complaint  Patient presents with   Chest Pain     Aug. 8, 2023    Darrell Frank is a 56 y.o. male with a hx of HTN, HLD, pre-diabetes,  and atypical CP We were asked to see him for further evaluation of his chest pain  Originally from [place]  3 months , had squeezing in his left chest . Lasted 5 seconds  Would recur on and off, Has not had it for the past 2 week Not exertional, ( occurs while driving )  Has started exercising , jogs, goes to the gym. Run / walks for 2 miles .   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 Swallow whole.   metoprolol tartrate (LOPRESSOR) 100 MG tablet Take one tablet by mouth 2 hours prior to CT     Allergies:   Patient has no known allergies.   Social History   Socioeconomic History   Marital status: Married    Spouse name: Not on file   Number of children: Not on file   Years of education: Not on file   Highest education level: Not on file  Occupational History   Occupation: Ecologist.  Tobacco Use   Smoking status: Former    Packs/day: 0.25    Years: 10.00    Total pack years: 2.50    Types: Cigarettes    Quit date: 12/05/2003    Years since quitting:  18.1   Smokeless tobacco: Never  Vaping Use   Vaping Use: Never used  Substance and Sexual Activity   Alcohol use: Not on file    Comment: minimal alcohol use/rare.   Drug use: Never   Sexual activity: Yes  Other Topics Concern   Not on file  Social History Narrative   Not on file   Social Determinants of Health   Financial Resource Strain: Not on file  Food Insecurity: Not on file  Transportation Needs: Not on file  Physical Activity: Not on file  Stress: Not on file  Social Connections: Not on file     Family History: The patient's family history includes Dementia in his mother; Stroke in his father.  ROS:   Please see the history of present illness.     All other systems reviewed and are negative.  EKGs/Labs/Other Studies Reviewed:    The following studies were reviewed today:   EKG:  January 03, 2022.  NSR at 86.  No ST or T wave changes  Recent Labs: 01/03/2022: ALT 42; BUN 14; Creatinine, Ser 0.99; Hemoglobin 14.2; Platelets 289.0; Potassium 4.1; Sodium  137; TSH 1.03  Recent Lipid Panel    Component Value Date/Time   CHOL 215 (H) 01/03/2022 0926   TRIG 126.0 01/03/2022 0926   HDL 34.50 (L) 01/03/2022 0926   CHOLHDL 6 01/03/2022 0926   VLDL 25.2 01/03/2022 0926   LDLCALC 156 (H) 01/03/2022 0926     Risk Assessment/Calculations:          Physical Exam:    VS:  BP 128/80   Pulse 78   Ht 5\' 10"  (1.778 m)   Wt 175 lb (79.4 kg)   SpO2 93%   BMI 25.11 kg/m     Wt Readings from Last 3 Encounters:  01/28/22 175 lb (79.4 kg)  01/03/22 182 lb (82.6 kg)  05/07/21 184 lb (83.5 kg)     GEN:  Well nourished, well developed in no acute distress HEENT: Normal NECK: No JVD; No carotid bruits LYMPHATICS: No lymphadenopathy CARDIAC: RRR, no murmurs, rubs, gallops RESPIRATORY:  Clear to auscultation without rales, wheezing or rhonchi  ABDOMEN: Soft, non-tender, non-distended MUSCULOSKELETAL:  No edema; No deformity  SKIN: Warm and dry NEUROLOGIC:  Alert  and oriented x 3 PSYCHIATRIC:  Normal affect   ASSESSMENT:    1. Precordial pain   2. Mixed hyperlipidemia   3. Unstable angina (HCC)   4. Primary hypertension    PLAN:       Unstable angina: 05/09/21 presents for further evaluation and management of several episodes of what sounds like unstable angina. He had chest squeezing and chest tightness that lasted for 10 to 15 seconds.  It occurred at rest.  He has a history of hyperlipidemia, prediabetes.  I like to get a coronary CT angiogram for further evaluation.  We will start him on aspirin 81 mg a day.  2.  Asthma :  he is allergic to cats and has 3 at home .  He takes albuterol       Medication Adjustments/Labs and Tests Ordered: Current medicines are reviewed at length with the patient today.  Concerns regarding medicines are outlined above.  Orders Placed This Encounter  Procedures   CT CORONARY MORPH W/CTA COR W/SCORE W/CA W/CM &/OR WO/CM   Meds ordered this encounter  Medications   aspirin EC 81 MG tablet    Sig: Take 1 tablet (81 mg total) by mouth daily. Swallow whole.    Dispense:  90 tablet    Refill:  3   metoprolol tartrate (LOPRESSOR) 100 MG tablet    Sig: Take one tablet by mouth 2 hours prior to CT    Dispense:  1 tablet    Refill:  0     Patient Instructions  Medication Instructions:  Your physician has recommended you make the following change in your medication:  1-START Aspirin 81 mg by mouth daily.  *If you need a refill on your cardiac medications before your next appointment, please call your pharmacy*  Lab Work: If you have labs (blood work) drawn today and your tests are completely normal, you will receive your results only by: MyChart Message (if you have MyChart) OR A paper copy in the mail If you have any lab test that is abnormal or we need to change your treatment, we will call you to review the results.  Testing/Procedures: Your physician has requested that you have cardiac CT. Cardiac  computed tomography (CT) is a painless test that uses an x-ray machine to take clear, detailed pictures of your heart. For further information please visit [facility]. Please follow instruction  sheet as given.  Follow-Up: At [facility], you and your health needs are our priority.  As part of our continuing mission to provide you with exceptional heart care, we have created designated Provider Care Teams.  These Care Teams include your primary Cardiologist (physician) and Advanced Practice Frank (APPs -  Physician Assistants and Nurse Practitioners) who all work together to provide you with the care you need, when you need it.  We recommend signing up for the patient portal called "MyChart".  Sign up information is provided on this After Visit Summary.  MyChart is used to connect with patients for Virtual Visits (Telemedicine).  Patients are able to view lab/test results, encounter notes, upcoming appointments, etc.  Non-urgent messages can be sent to your provider as well.   To learn more about what you can do with MyChart, go to [email or web address].    Your next appointment:   3 month(s)  The format for your next appointment:   In Person  Provider:   Kristeen Miss, MD {   Other Instructions   Your cardiac CT will be scheduled at one of the below locations:   [facility] [address] [place], [place] [address] [phone]  OR  [facility] [address] [place], [place] [address] [phone]  If scheduled at [facility], please arrive at the [facility] and Children's Entrance (Entrance C2) of [facility] 30 minutes prior to test start time. You can use the FREE valet parking offered at entrance C (encouraged to control the heart rate for the test)  Proceed to the [facility] Radiology Department (first floor) to check-in and test prep.  All radiology patients and guests should use  entrance C2 at [facility], accessed from [facility], even though the hospital's physical address listed is [address]    If scheduled at [facility], please arrive 15 mins early for check-in and test prep.  Please follow these instructions carefully (unless otherwise directed):  Hold all erectile dysfunction medications at least 3 days (72 hrs) prior to test.  On the Night Before the Test: Be sure to Drink plenty of water. Do not consume any caffeinated/decaffeinated beverages or chocolate 12 hours prior to your test. Do not take any antihistamines 12 hours prior to your test.  On the Day of the Test: Drink plenty of water until 1 hour prior to the test. Do not eat any food 4 hours prior to the test. You may take your regular medications prior to the test.  Take metoprolol (Lopressor) 100 mg two hours prior to test.  After the Test: Drink plenty of water. After receiving IV contrast, you may experience a mild flushed feeling. This is normal. On occasion, you may experience a mild rash up to 24 hours after the test. This is not dangerous. If this occurs, you can take Benadryl 25 mg and increase your fluid intake. If you experience trouble breathing, this can be serious. If it is severe call 911 IMMEDIATELY. If it is mild, please call our office. If you take any of these medications: Glipizide/Metformin, Avandament, Glucavance, please do not take 48 hours after completing test unless otherwise instructed.  We will call to schedule your test 2-4 weeks out understanding that some insurance companies will need an authorization prior to the service being performed.   For non-scheduling related questions, please contact the cardiac imaging nurse navigator should you have any questions/concerns: [place]  Earlene Plater, Cardiac Imaging Nurse Navigator Larey Brick, Cardiac Imaging Nurse Navigator [facility]  [facility] Direct Office Dial: [phone]   For scheduling needs, including cancellations and rescheduling, please call [place], [phone].   Important Information About Sugar         Signed, Kristeen Miss, MD  01/28/2022 9:07 AM    [facility]

## 2022-01-28 ENCOUNTER — Encounter: Payer: Self-pay | Admitting: Cardiovascular Disease

## 2022-01-28 ENCOUNTER — Ambulatory Visit (INDEPENDENT_AMBULATORY_CARE_PROVIDER_SITE_OTHER): Payer: PRIVATE HEALTH INSURANCE | Admitting: Cardiovascular Disease

## 2022-01-28 VITALS — BP 128/80 | HR 78 | Ht 70.0 in | Wt 175.0 lb

## 2022-01-28 DIAGNOSIS — I200 Unstable angina: Secondary | ICD-10-CM

## 2022-01-28 DIAGNOSIS — E782 Mixed hyperlipidemia: Secondary | ICD-10-CM | POA: Diagnosis not present

## 2022-01-28 DIAGNOSIS — I10 Essential (primary) hypertension: Secondary | ICD-10-CM | POA: Diagnosis not present

## 2022-01-28 DIAGNOSIS — R072 Precordial pain: Secondary | ICD-10-CM | POA: Diagnosis not present

## 2022-01-28 DIAGNOSIS — E785 Hyperlipidemia, unspecified: Secondary | ICD-10-CM | POA: Insufficient documentation

## 2022-01-28 MED ORDER — ASPIRIN 81 MG PO TBEC: 81.0000 mg | DELAYED_RELEASE_TABLET | Freq: Every day | ORAL | 3 refills | Status: DC

## 2022-01-28 MED ORDER — METOPROLOL TARTRATE 100 MG PO TABS: ORAL_TABLET | ORAL | 0 refills | Status: DC

## 2022-01-28 NOTE — Patient Instructions (Addendum)
Medication Instructions:  Your physician has recommended you make the following change in your medication:  1-START Aspirin 81 mg by mouth daily.  *If you need a refill on your cardiac medications before your next appointment, please call your pharmacy*  Lab Work: If you have labs (blood work) drawn today and your tests are completely normal, you will receive your results only by: MyChart Message (if you have MyChart) OR A paper copy in the mail If you have any lab test that is abnormal or we need to change your treatment, we will call you to review the results.  Testing/Procedures: Your physician has requested that you have cardiac CT. Cardiac computed tomography (CT) is a painless test that uses an x-ray machine to take clear, detailed pictures of your heart. For further information please visit [email or web address] Please follow instruction sheet as given.  Follow-Up: At [facility], you and your health needs are our priority.  As part of our continuing mission to provide you with exceptional heart care, we have created designated Provider Care Teams.  These Care Teams include your primary Cardiologist (physician) and Advanced Practice Providers (APPs -  Physician Assistants and Nurse Practitioners) who all work together to provide you with the care you need, when you need it.  We recommend signing up for the patient portal called "MyChart".  Sign up information is provided on this After Visit Summary.  MyChart is used to connect with patients for Virtual Visits (Telemedicine).  Patients are able to view lab/test results, encounter notes, upcoming appointments, etc.  Non-urgent messages can be sent to your provider as well.   To learn more about what you can do with MyChart, go to [email or web address].    Your next appointment:   3 month(s)  The format for your next appointment:   In Person  Provider:   Kristeen Miss, MD {   Other Instructions   Your cardiac CT will be  scheduled at   [facility] [address] [place], [place] [address] [phone]  If scheduled at [facility], please arrive at the [facility] and Children's Entrance (Entrance C2) of [facility] 30 minutes prior to test start time. You can use the FREE valet parking offered at entrance C (encouraged to control the heart rate for the test)  Proceed to the [facility] Radiology Department (first floor) to check-in and test prep.  All radiology patients and guests should use entrance C2 at [facility], accessed from [facility], even though the hospital's physical address listed is [address]    Please follow these instructions carefully (unless otherwise directed):  Hold all erectile dysfunction medications at least 3 days (72 hrs) prior to test.  On the Night Before the Test: Be sure to Drink plenty of water. Do not consume any caffeinated/decaffeinated beverages or chocolate 12 hours prior to your test. Do not take any antihistamines 12 hours prior to your test.  On the Day of the Test: Drink plenty of water until 1 hour prior to the test. Do not eat any food 4 hours prior to the test. You may take your regular medications prior to the test.  Take metoprolol (Lopressor) 100 mg two hours prior to test.  After the Test: Drink plenty of water. After receiving IV contrast, you may experience a mild flushed feeling. This is normal. On occasion, you may experience a mild rash up to 24 hours after the test. This is not dangerous. If this occurs,  you can take Benadryl 25 mg and increase your fluid intake. If you experience trouble breathing, this can be serious. If it is severe call 911 IMMEDIATELY. If it is mild, please call our office. If you take any of these medications: Glipizide/Metformin, Avandament, Glucavance, please do not take 48 hours after completing test unless otherwise instructed.  We will call to schedule your  test 2-4 weeks out understanding that some insurance companies will need an authorization prior to the service being performed.   For non-scheduling related questions, please contact the cardiac imaging nurse navigator should you have any questions/concerns: Rockwell Alexandria, Cardiac Imaging Nurse Navigator Larey Brick, Cardiac Imaging Nurse Navigator [facility] Direct Office Dial: [phone]   For scheduling needs, including cancellations and rescheduling, please call [place], [phone].   Important Information About Sugar

## 2022-02-17 ENCOUNTER — Telehealth (HOSPITAL_COMMUNITY): Payer: Self-pay | Admitting: *Deleted

## 2022-02-17 NOTE — Telephone Encounter (Signed)
Reaching out to patient to offer assistance regarding upcoming cardiac imaging study; pt verbalizes understanding of appt date/time, parking situation and where to check in, pre-test NPO status and medications ordered, and verified current allergies; name and call back number provided for further questions should they arise  Danie Diehl RN Navigator Cardiac Imaging [facility] [phone] office [phone] cell  Patient to take 100mg metoprolol tartrate two hours prior to his cardiac CT scan. He is aware to arrive at 3:30pm. 

## 2022-02-18 ENCOUNTER — Other Ambulatory Visit: Payer: Self-pay | Admitting: Cardiology

## 2022-02-18 ENCOUNTER — Ambulatory Visit (HOSPITAL_COMMUNITY)
Admission: RE | Admit: 2022-02-18 | Discharge: 2022-02-18 | Disposition: A | Discharge status: Home / Self Care | Payer: PRIVATE HEALTH INSURANCE | Source: Ambulatory Visit | Attending: Cardiovascular Disease | Admitting: Cardiovascular Disease

## 2022-02-18 DIAGNOSIS — R072 Precordial pain: Secondary | ICD-10-CM | POA: Insufficient documentation

## 2022-02-18 DIAGNOSIS — R931 Abnormal findings on diagnostic imaging of heart and coronary circulation: Secondary | ICD-10-CM | POA: Diagnosis present

## 2022-02-18 DIAGNOSIS — I2510 Atherosclerotic heart disease of native coronary artery without angina pectoris: Secondary | ICD-10-CM

## 2022-02-18 MED ORDER — NITROGLYCERIN 0.4 MG SL SUBL
SUBLINGUAL_TABLET | SUBLINGUAL | Status: AC
  Filled 2022-02-18: qty 2

## 2022-02-18 MED ORDER — NITROGLYCERIN 0.4 MG SL SUBL
0.8000 mg | SUBLINGUAL_TABLET | Freq: Once | SUBLINGUAL | Status: AC
  Administered 2022-02-18: 0.8 mg via SUBLINGUAL

## 2022-02-18 MED ORDER — IOHEXOL 350 MG/ML SOLN
100.0000 mL | Freq: Once | INTRAVENOUS | Status: AC | PRN
  Administered 2022-02-18: 100 mL via INTRAVENOUS

## 2022-02-19 ENCOUNTER — Ambulatory Visit (HOSPITAL_COMMUNITY)
Admission: RE | Admit: 2022-02-19 | Discharge: 2022-02-19 | Disposition: A | Discharge status: Home / Self Care | Payer: PRIVATE HEALTH INSURANCE | Source: Ambulatory Visit | Attending: Cardiology | Admitting: Cardiology

## 2022-02-19 ENCOUNTER — Ambulatory Visit (HOSPITAL_BASED_OUTPATIENT_CLINIC_OR_DEPARTMENT_OTHER)
Admission: RE | Admit: 2022-02-19 | Discharge: 2022-02-19 | Disposition: A | Discharge status: Home / Self Care | Payer: PRIVATE HEALTH INSURANCE | Source: Ambulatory Visit | Attending: Cardiology | Admitting: Cardiology

## 2022-02-19 DIAGNOSIS — R931 Abnormal findings on diagnostic imaging of heart and coronary circulation: Secondary | ICD-10-CM

## 2022-02-26 ENCOUNTER — Telehealth: Payer: Self-pay

## 2022-02-26 DIAGNOSIS — Z79899 Other long term (current) drug therapy: Secondary | ICD-10-CM

## 2022-02-26 MED ORDER — ROSUVASTATIN CALCIUM 20 MG PO TABS: 20.0000 mg | ORAL_TABLET | Freq: Every day | ORAL | 3 refills | Status: DC

## 2022-02-26 NOTE — Telephone Encounter (Signed)
-----   Message from Vesta Mixer, MD sent at 02/20/2022 11:09 AM EDT ----- Coronary calcium score of 4.27. This was 48 percentile for age-, sex, and race-matched controls Mild plaque in the LAD  Moderate plaque in the mid RCA   FFR suggests that there are no significant obstructive plaques   His chest tightness / squeezing does not appear to be coming from his heart  He does have hyperlipidemia  LDL is 156.  Given the presence of some degree of CAD, his goal LDL is 55.    Lets start rosuvastatin 20 mg a day .  He needs to eat a very low cholesterol, low fat diet.  Recheck lipids and  ALT in 2-3 months .

## 2022-02-26 NOTE — Telephone Encounter (Signed)
Called and spoke with patient. He agrees to plan. Crestor sent to pharmacy on file, labs placed and scheduled for 05/26/22.

## 2022-03-21 ENCOUNTER — Encounter: Payer: Self-pay | Admitting: Dietician

## 2022-03-21 ENCOUNTER — Encounter: Payer: PRIVATE HEALTH INSURANCE | Attending: Medical | Admitting: Dietician

## 2022-03-21 DIAGNOSIS — E119 Type 2 diabetes mellitus without complications: Secondary | ICD-10-CM | POA: Insufficient documentation

## 2022-03-21 NOTE — Progress Notes (Signed)
Diabetes Self-Management Education  Visit Type: First/Initial  Appt. Start Time: 1415 Appt. End Time: 1535  03/21/2022  Mr. Darrell Frank, identified by name and date of birth, is a 56 y.o. male with a diagnosis of Diabetes: Type 2.   ASSESSMENT Patient is here today alone.  Referral:  Type 2 Diabetes (newly diagnosed)  History includes:  Type 2 Diabetes, HTN, HLD, asthma Labs noted:  A1C 6.9% 01/03/2022 increased form 6.3% 08/24/2020, Cholesterol 215, HDL 34, LDL 156, Triglycerides 126 01/03/2022, vitamin B-12 292, thiamin <6 01/03/2022  Weight hx:   70" 173 lbs 03/21/2022 180 lbs 12/2021  Patient lives with his wife and 3 children.  His wife does most of the shopping and cooking.  He states she is strict with cooking healthy foods.  He has a Company secretary.  He frequently works in a [employer].   Tracks steps, gym, jogs, lifts weights, stretching. He is from [place].  Food allergy test by his sister in [place] at a holistic medical clinic but has been unable to correlate any symptoms with the allergies. Muslim.   Height 5\' 10"  (1.778 m), weight 173 lb (78.5 kg). Body mass index is 24.82 kg/m.   Diabetes Self-Management Education - 03/21/22 1432       Visit Information   Visit Type First/Initial      Initial Visit   Diabetes Type Type 2    Date Diagnosed 12/2021    Are you currently following a meal plan? No    Are you taking your medications as prescribed? Not on Medications      Health Coping   How would you rate your overall health? Good      Psychosocial Assessment   Patient Belief/Attitude about Diabetes Motivated to manage diabetes    What is the hardest part about your diabetes right now, causing you the most concern, or is the most worrisome to you about your diabetes?   --   nothing at this time   Self-care barriers None    Self-management support Doctor's office    Patient Concerns Nutrition/Meal planning    Special Needs None     Preferred Learning Style No preference indicated    Learning Readiness Ready    How often do you need to have someone help you when you read instructions, pamphlets, or other written materials from your doctor or pharmacy? 1 - Never    What is the last grade level you completed in school? 3 years college      Pre-Education Assessment   Patient understands the diabetes disease and treatment process. Needs Instruction    Patient understands incorporating nutritional management into lifestyle. Needs Instruction    Patient undertands incorporating physical activity into lifestyle. Needs Instruction    Patient understands using medications safely. Needs Instruction    Patient understands monitoring blood glucose, interpreting and using results Needs Instruction    Patient understands prevention, detection, and treatment of acute complications. Needs Instruction    Patient understands prevention, detection, and treatment of chronic complications. Needs Instruction    Patient understands how to develop strategies to address psychosocial issues. Needs Instruction    Patient understands how to develop strategies to promote health/change behavior. Needs Instruction      Complications   Last HgB A1C per patient/outside source 6.9 %   01/03/2022   How often do you check your blood sugar? 0 times/day (not testing)   Blood glucose 148 now 1 1/2 hours after lunch  Have you had a dilated eye exam in the past 12 months? Yes    Have you had a dental exam in the past 12 months? Yes    Are you checking your feet? Yes    How many days per week are you checking your feet? 7      Dietary Intake   Breakfast none    Lunch suchi or sachimi OR fried [employer] OR burger king    Snack (afternoon) none    Dinner 2 avocados, salmon OR fruit OR salad    Snack (evening) none    Beverage(s) water, occasional regular soda, gatorade      Activity / Exercise   Activity / Exercise Type Light (walking / raking leaves)     How many days per week do you exercise? 3    How many minutes per day do you exercise? 60    Total minutes per week of exercise 180      Patient Education   Previous Diabetes Education No    Disease Pathophysiology Definition of diabetes, type 1 and 2, and the diagnosis of diabetes;Explored patient's options for treatment of their diabetes    Healthy Eating Role of diet in the treatment of diabetes and the relationship between the three main macronutrients and blood glucose level;Food label reading, portion sizes and measuring food.;Plate Method;Meal options for control of blood glucose level and chronic complications.;Information on hints to eating out and maintain blood glucose control.;Effects of alcohol on blood glucose and safety factors with consumption of alcohol.    Being Active Role of exercise on diabetes management, blood pressure control and cardiac health.    Monitoring Identified appropriate SMBG and/or A1C goals.;Purpose and frequency of SMBG.    Acute complications Taught prevention, symptoms, and  treatment of hypoglycemia - the 15 rule.;Discussed and identified patients' prevention, symptoms, and treatment of hyperglycemia.    Chronic complications Relationship between chronic complications and blood glucose control    Diabetes Stress and Support Identified and addressed patients feelings and concerns about diabetes;Worked with patient to identify barriers to care and solutions;Role of stress on diabetes;Helped patient identify a support system for diabetes management;Travel strategies      Individualized Goals (developed by patient)   Nutrition General guidelines for healthy choices and portions discussed    Physical Activity Exercise 3-5 times per week;60 minutes per day    Medications take my medication as prescribed    Problem Solving Addressing barriers to behavior change    Reducing Risk do foot checks daily      Post-Education Assessment   Patient understands the  diabetes disease and treatment process. Demonstrates understanding / competency    Patient understands incorporating nutritional management into lifestyle. Comprehends key points    Patient undertands incorporating physical activity into lifestyle. Demonstrates understanding / competency    Patient understands using medications safely. Demonstrates understanding / competency    Patient understands monitoring blood glucose, interpreting and using results Demonstrates understanding / competency    Patient understands prevention, detection, and treatment of acute complications. Demonstrates understanding / competency    Patient understands prevention, detection, and treatment of chronic complications. Demonstrates understanding / competency    Patient understands how to develop strategies to address psychosocial issues. Demonstrates understanding / competency    Patient understands how to develop strategies to promote health/change behavior. Comprehends key points      Outcomes   Expected Outcomes Demonstrated interest in learning. Expect positive outcomes    Future DMSE 2 months  Program Status Completed             Individualized Plan for Diabetes Self-Management Training:   Learning Objective:  Patient will have a greater understanding of diabetes self-management. Patient education plan is to attend individual and/or group sessions per assessed needs and concerns.   Plan:   Patient Instructions  Continue your Vitamin B-Complex Consider continuing CoQ-10 with your statin Consider getting your vitamin D checked        Continue to read labels Drink mostly water Balance your diet   Expected Outcomes:  Demonstrated interest in learning. Expect positive outcomes  Education material provided: ADA - How to Thrive: A Guide for Your Journey with Diabetes, Food label handouts, Meal plan card, and Diabetes Resources; [employer] of lifestyle medication packet  If problems or  questions, patient to contact team via:  Phone  Future DSME appointment: 2 months

## 2022-03-21 NOTE — Patient Instructions (Addendum)
Continue your Vitamin B-Complex Consider continuing CoQ-10 with your statin Consider getting your vitamin D checked        Continue to read labels Drink mostly water Balance your diet

## 2022-03-22 IMAGING — DX DG LUMBAR SPINE 2-3V
3 series · 3 of 3 positions shown · non-contrast
Comparison: None.

CLINICAL DATA: Intermittent low back pain.

EXAM:
LUMBAR SPINE - 2-3 VIEW

[l-spine ap]
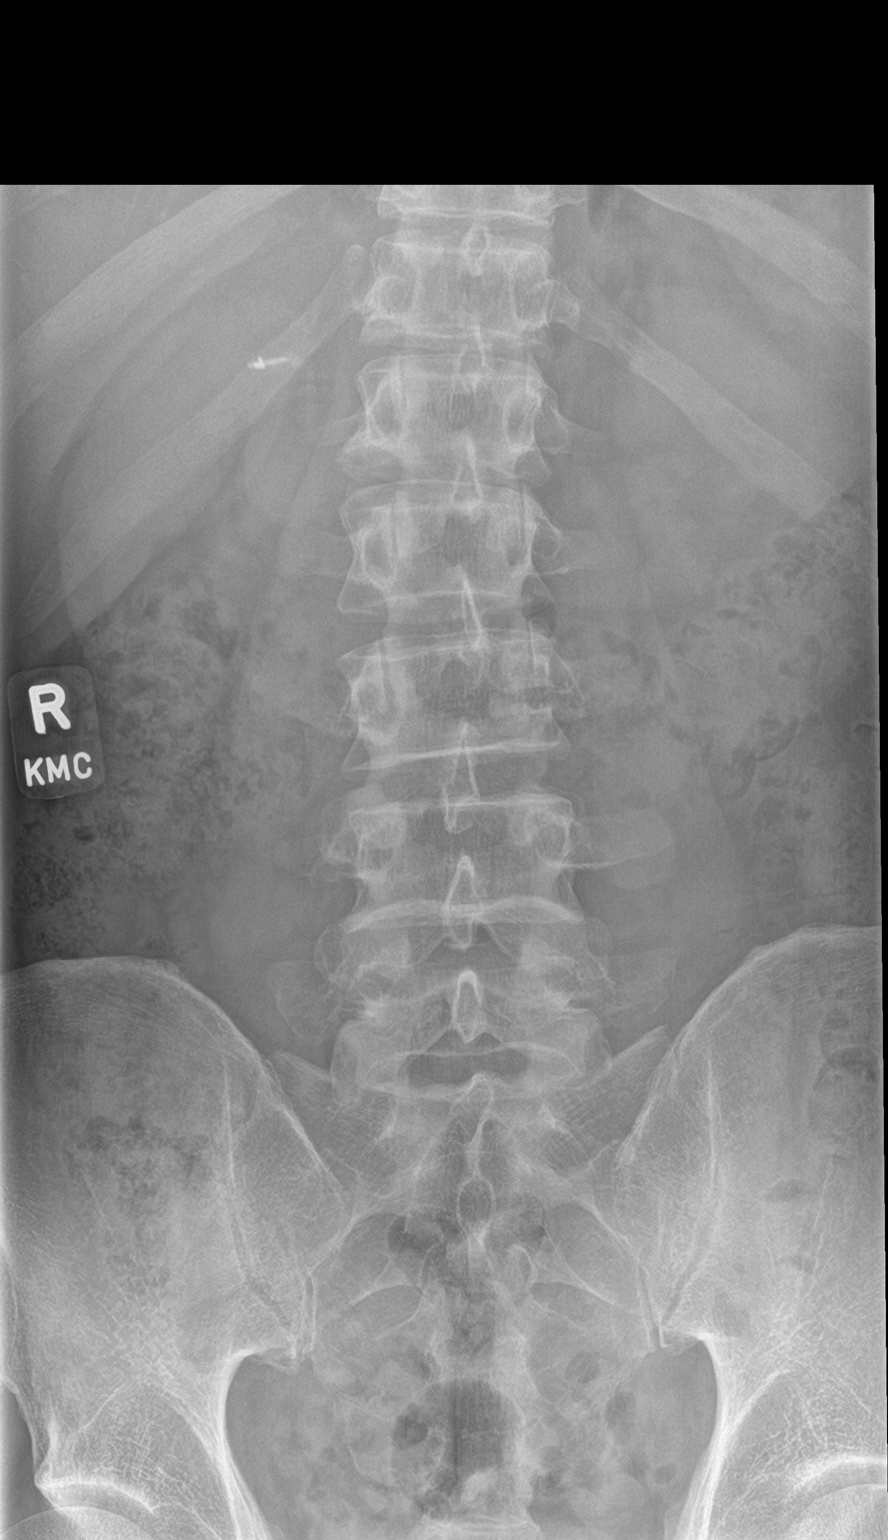

[l-spine lat]
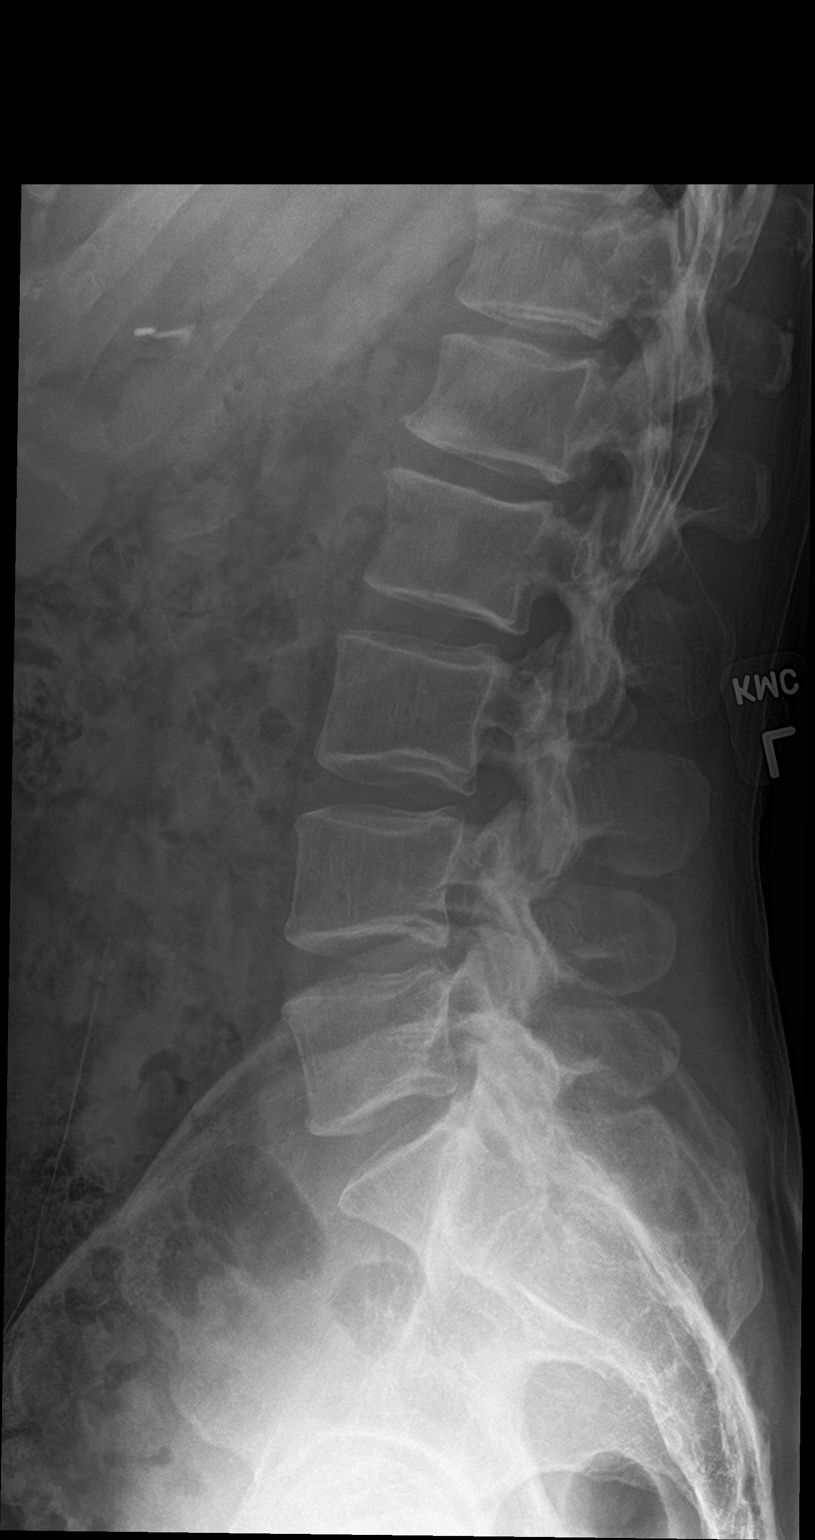

[l-spine spot]
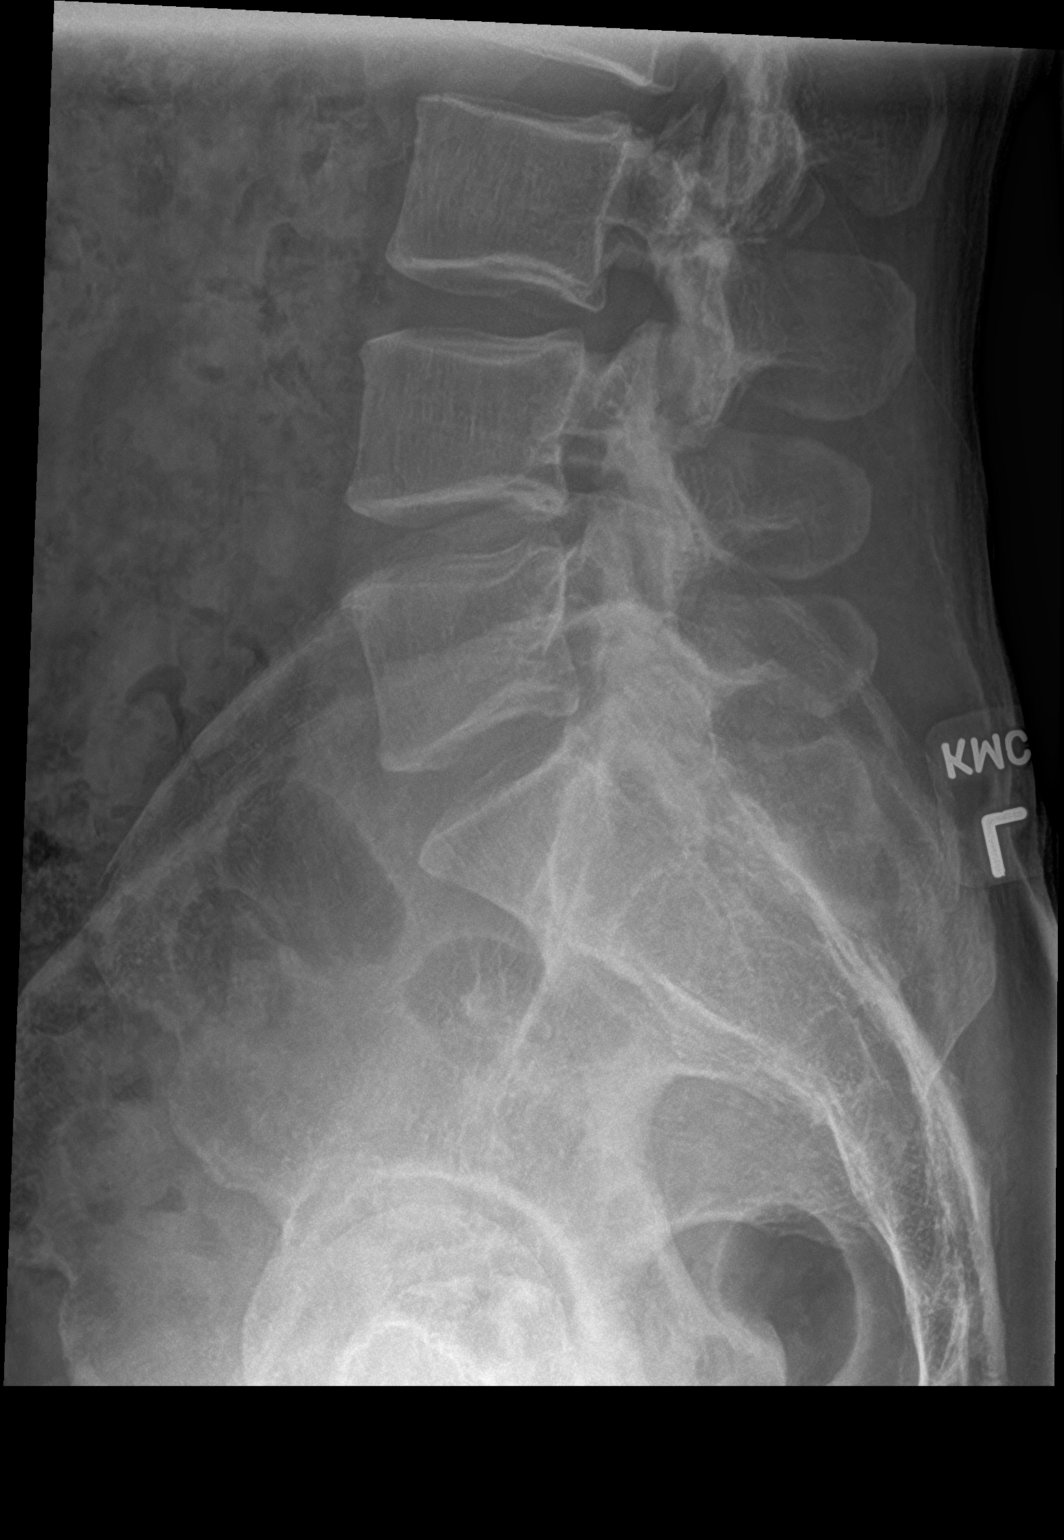

[3 of 3 positions shown; findings below may reference images not displayed]

FINDINGS: There is no evidence of lumbar spine fracture. Alignment is normal.
Intervertebral disc spaces are maintained.
IMPRESSION: Negative.

## 2022-03-22 IMAGING — DX DG CERVICAL SPINE COMPLETE 4+V
6 series · 6 of 6 positions shown · non-contrast
Comparison: None.

CLINICAL DATA: 54-year-old male with neck pain. Pain radiating to
the trapezius. Pain greater on the right.

EXAM:
CERVICAL SPINE - COMPLETE 4+ VIEW

[c-spine lat]
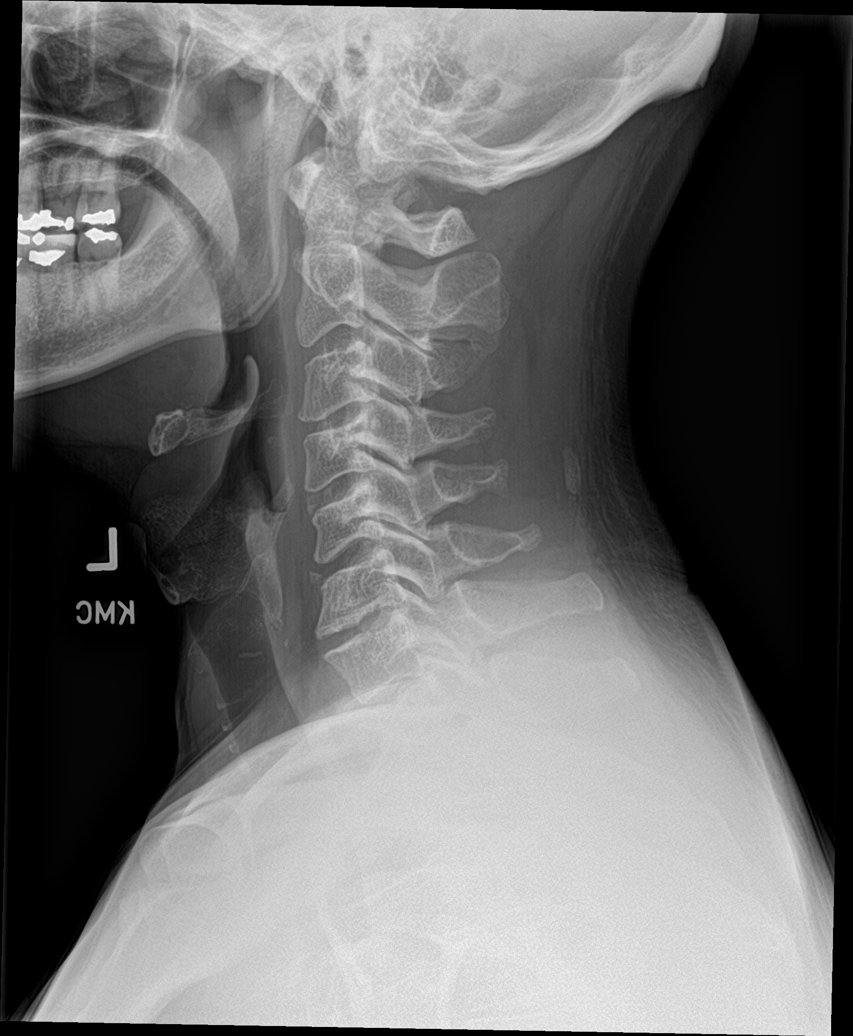

[c-spine obl (1 of 2)]
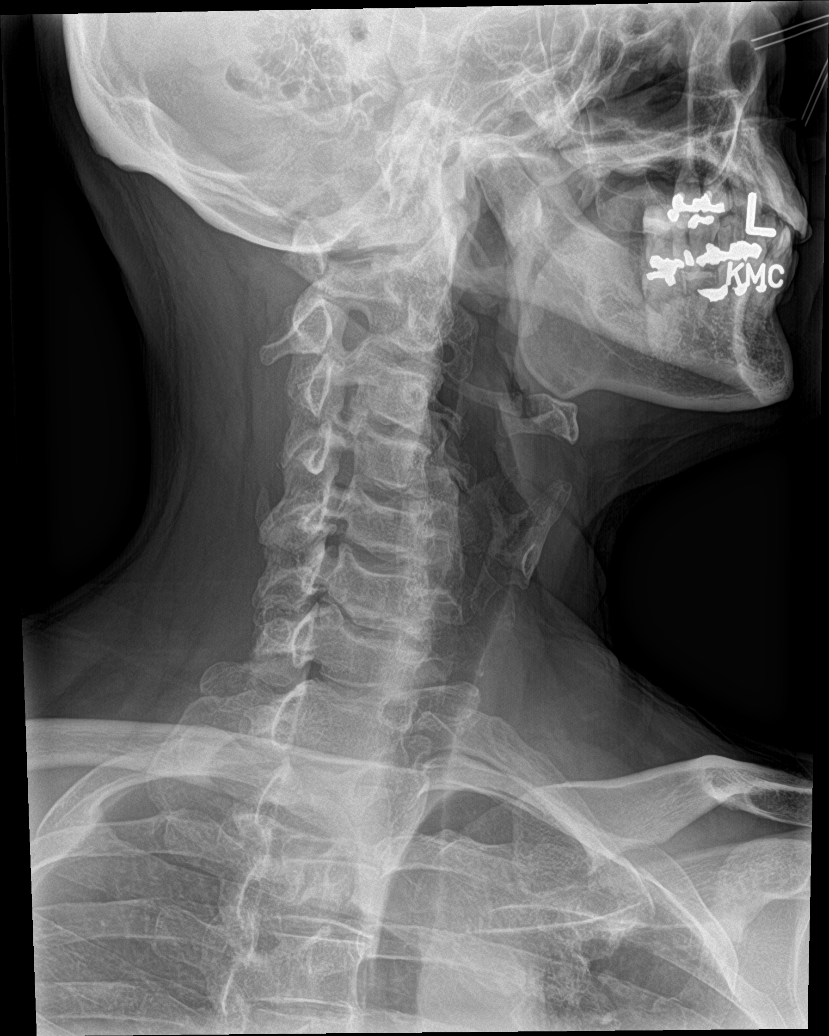

[c-spine obl (2 of 2)]
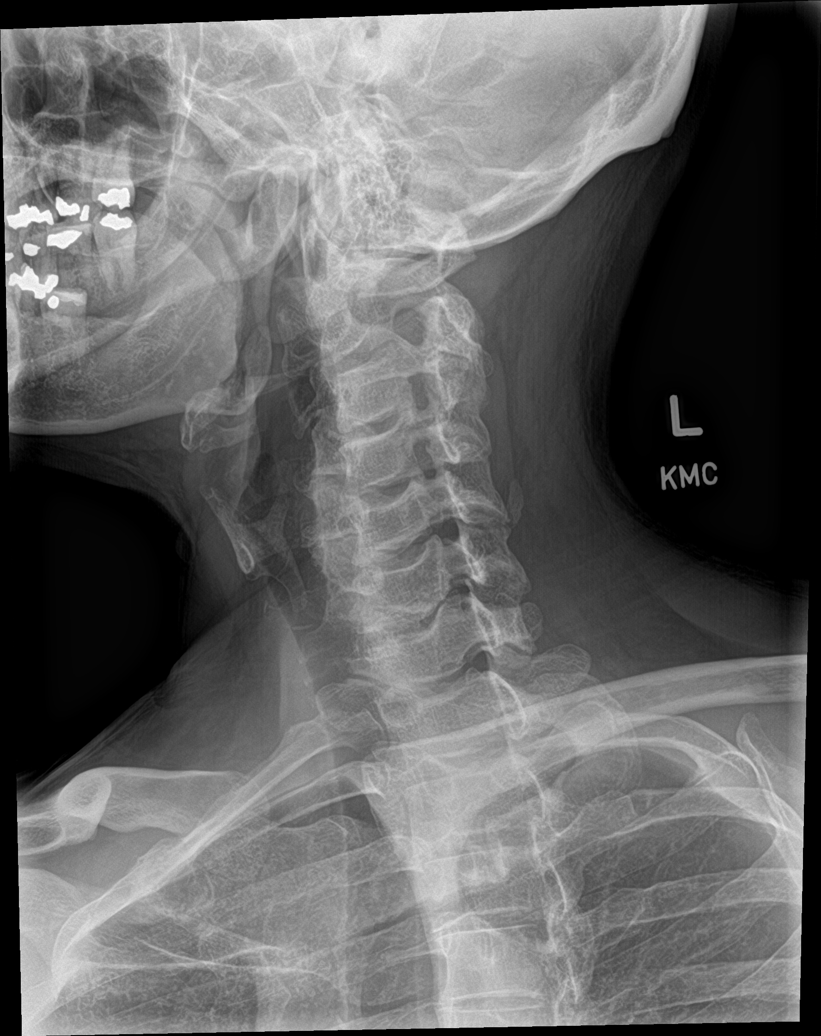

[c-spine ap]
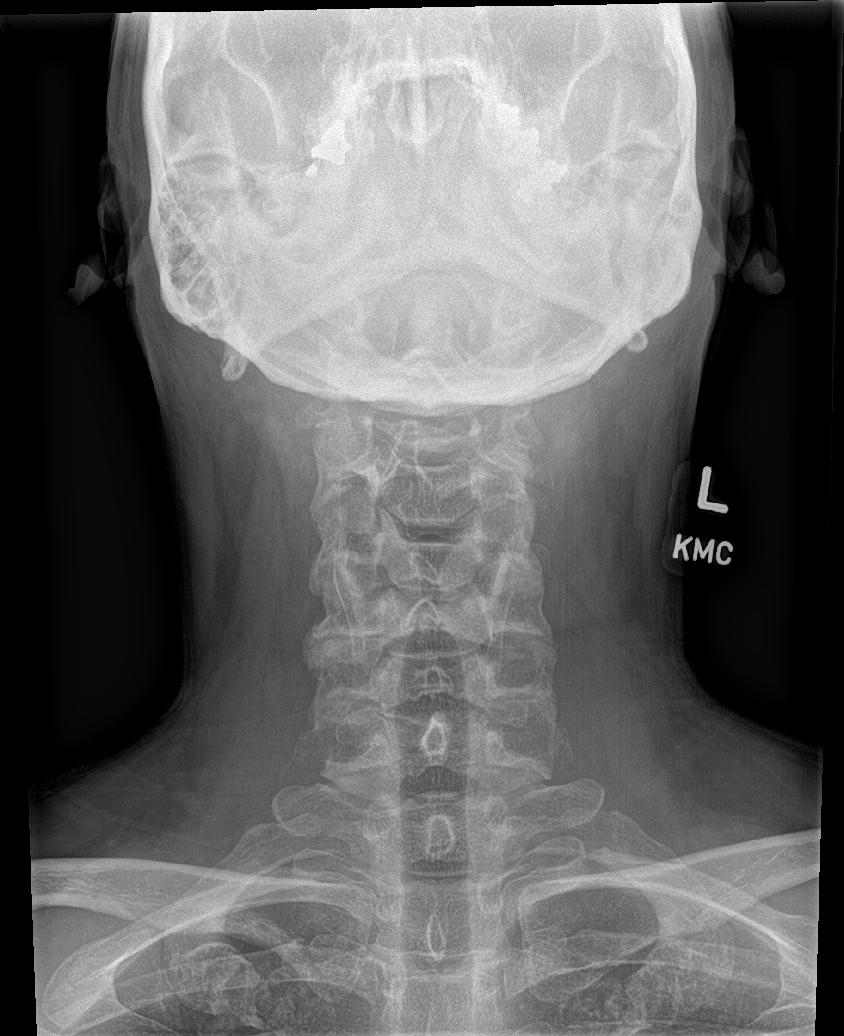

[c-spine open mouth]
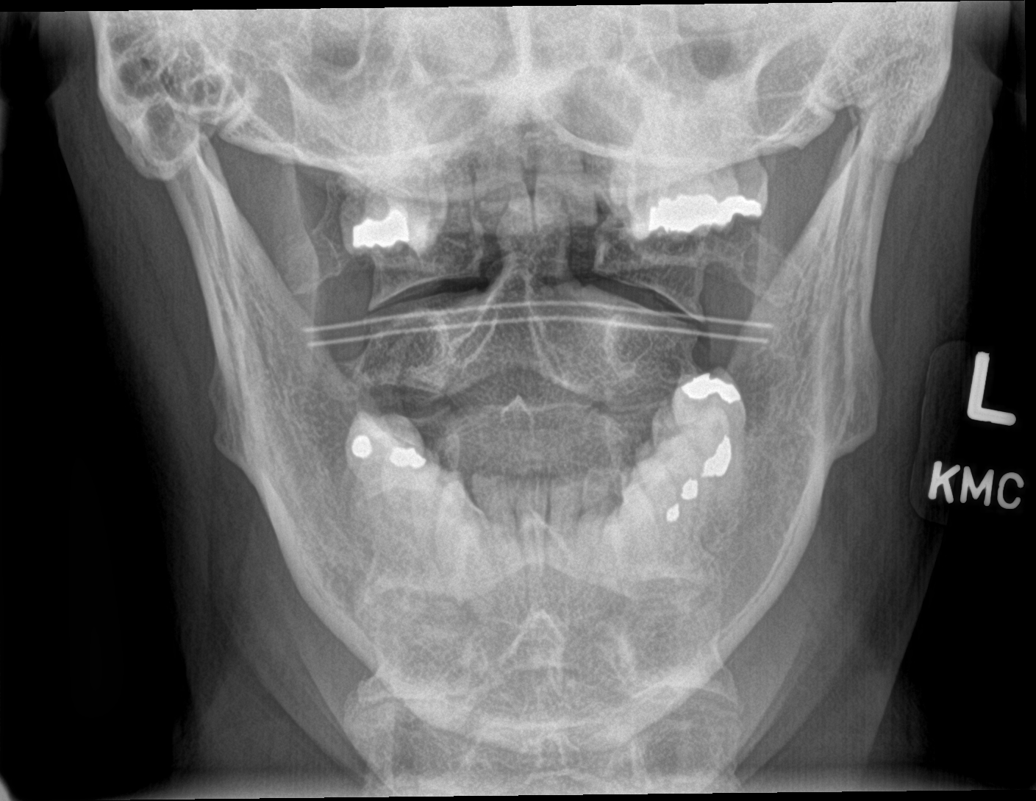

[c-spine swimmers]
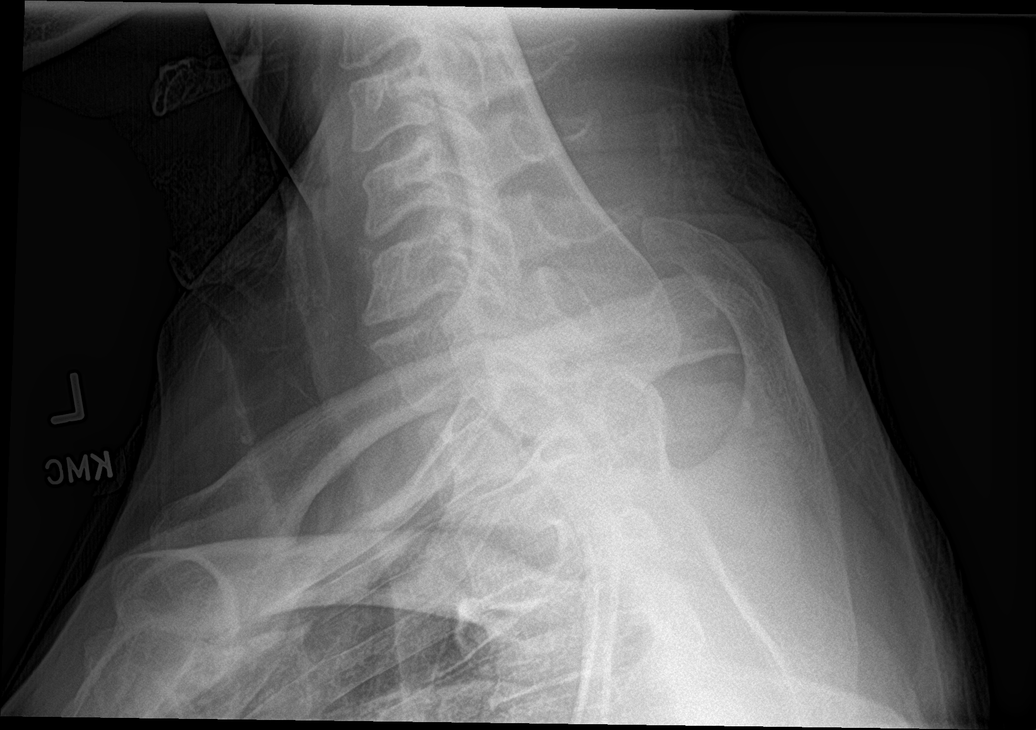

[6 of 6 positions shown; findings below may reference images not displayed]

FINDINGS: Normal prevertebral soft tissue contour. Straightening of cervical
lordosis. Bilateral posterior element alignment is within normal
limits. Cervicothoracic junction alignment is within normal limits.
Normal cervical AP alignment. Normal C1-C2 alignment and joint
spaces. No acute osseous abnormality identified.

Disc space loss at C6-C7 with endplate spurring. Other disc spaces
relatively preserved. Mild bilateral facet hypertrophy.

Negative visible upper chest.
IMPRESSION: 1. No acute osseous abnormality identified in the cervical spine.
2. Chronic C6-C7 disc and endplate degeneration.

## 2022-04-01 ENCOUNTER — Ambulatory Visit: Payer: PRIVATE HEALTH INSURANCE | Admitting: Dietician

## 2022-05-02 ENCOUNTER — Ambulatory Visit: Payer: Self-pay | Admitting: Cardiovascular Disease

## 2022-05-22 ENCOUNTER — Encounter: Payer: Self-pay | Admitting: Dietician

## 2022-05-22 ENCOUNTER — Encounter: Payer: PRIVATE HEALTH INSURANCE | Attending: Medical | Admitting: Dietician

## 2022-05-22 VITALS — Wt 173.0 lb

## 2022-05-22 DIAGNOSIS — E1165 Type 2 diabetes mellitus with hyperglycemia: Secondary | ICD-10-CM | POA: Insufficient documentation

## 2022-05-22 NOTE — Patient Instructions (Signed)
Be sure to nourish your body.  Lunch and dinner daily  Nutrition quality  Increase your vegetable intake  Snack or light meal options:  Fruit and nuts  Vegetables, whole grain crackers, hummus  Salad with edemame or marinated tofu  Soup with edemame or tofu and vegetables  Get back to the gym or being active in general.

## 2022-05-22 NOTE — Progress Notes (Signed)
Diabetes Self-Management Education  Visit Type: Follow-up  Appt. Start Time: 1545 Appt. End Time: 1615  05/22/2022  Mr. Darrell Frank, identified by name and date of birth, is a 56 y.o. male with a diagnosis of Diabetes: Type 2.   ASSESSMENT Patient is here tdoay alone.  He was last seen by myself on 04-20-2022.  Father passed away recently and he returned from his father's funeral 3 weeks ago. Double vision and stopped his statin. No recent labs Avoiding sugar containing drinks Less meat, less sweets, less fast food and more vegetables. Not exercising.    Referral:  Type 2 Diabetes (newly diagnosed) History includes:  Type 2 Diabetes, HTN, HLD, asthma Labs noted:  A1C 6.9% 01/03/2022 increased form 6.3% 08/24/2020, Cholesterol 215, HDL 34, LDL 156, Triglycerides 126 01/03/2022, vitamin B-12 292, thiamin <6 01/03/2022   Weight hx:   70" 173 lbs 20-Apr-2022 and 05/22/2022 180 lbs 12/2021   Patient lives with his wife and 3 children.  His wife does most of the shopping and cooking.  He states she is strict with cooking healthy foods.  He has a Company secretary.  He frequently works in a [employer].   Tracks steps, gym, jogs, lifts weights, stretching. He is from [place].  Food allergy test by his sister in [place] at a holistic medical clinic but has been unable to correlate any symptoms with the allergies. Muslim.   Weight 173 lb (78.5 kg). Body mass index is 24.82 kg/m.   Diabetes Self-Management Education - 05/22/22 1712       Visit Information   Visit Type Follow-up      Initial Visit   Diabetes Type Type 2    Are you taking your medications as prescribed? Not on Medications      Psychosocial Assessment   What is the hardest part about your diabetes right now, causing you the most concern, or is the most worrisome to you about your diabetes?   Being active    Self-management support Doctor's office;CDE visits    Other persons present Patient     Patient Concerns Nutrition/Meal planning    Special Needs None      Pre-Education Assessment   Patient understands the diabetes disease and treatment process. Comprehends key points    Patient understands incorporating nutritional management into lifestyle. Comprehends key points    Patient undertands incorporating physical activity into lifestyle. Comprehends key points    Patient understands using medications safely. Comprehends key points    Patient understands monitoring blood glucose, interpreting and using results Needs Review    Patient understands prevention, detection, and treatment of acute complications. N/A (comment)    Patient understands prevention, detection, and treatment of chronic complications. Demonstrates understanding / competency    Patient understands how to develop strategies to address psychosocial issues. Comprehends key points    Patient understands how to develop strategies to promote health/change behavior. Comprehends key points      Complications   How often do you check your blood sugar? 0 times/day (not testing)      Dietary Intake   Breakfast skips    Snack (afternoon) sachimi/sushi OR taco's    Dinner soup, fruit OR fruit alone OR Salad, salmon, fruit, avocado    Snack (evening) occasional chips    Beverage(s) 4-5 bottles water daily, unsweetened tea, occasional regular sweet tea or regular coke      Activity / Exercise   Activity / Exercise Type ADL's  Patient Education   Healthy Eating Meal options for control of blood glucose level and chronic complications.;Other (comment)   eating for adequate nutrition (protein, vitamins, minerals)   Monitoring Identified appropriate SMBG and/or A1C goals.    Diabetes Stress and Support Identified and addressed patients feelings and concerns about diabetes;Worked with patient to identify barriers to care and solutions      Individualized Goals (developed by patient)   Nutrition General guidelines for  healthy choices and portions discussed    Physical Activity Exercise 3-5 times per week;60 minutes per day    Medications Not Applicable    Problem Solving Eating Pattern      Patient Self-Evaluation of Goals - Patient rates self as meeting previously set goals (% of time)   Nutrition 50 - 75 % (half of the time)    Physical Activity < 25% (hardly ever/never)    Medications Not Applicable    Problem Solving and behavior change strategies  Not Applicable    Reducing Risk (treating acute and chronic complications) 50 - 75 % (half of the time)    Health Coping 50 - 75 % (half of the time)      Post-Education Assessment   Patient understands the diabetes disease and treatment process. Demonstrates understanding / competency    Patient understands incorporating nutritional management into lifestyle. Demonstrates understanding / competency    Patient undertands incorporating physical activity into lifestyle. Demonstrates understanding / competency    Patient understands using medications safely. Demonstrates understanding / competency    Patient understands monitoring blood glucose, interpreting and using results Demonstrates understanding / competency    Patient understands prevention, detection, and treatment of acute complications. Demonstrates understanding / competency    Patient understands prevention, detection, and treatment of chronic complications. Demonstrates understanding / competency    Patient understands how to develop strategies to address psychosocial issues. Demonstrates understanding / competency    Patient understands how to develop strategies to promote health/change behavior. Demonstrates understanding / competency      Outcomes   Expected Outcomes Demonstrated interest in learning. Expect positive outcomes    Future DMSE PRN    Program Status Completed      Subsequent Visit   Since your last visit have you continued or begun to take your medications as prescribed? Not  on Medications    Since your last visit have you experienced any weight changes? No change             Individualized Plan for Diabetes Self-Management Training:   Learning Objective:  Patient will have a greater understanding of diabetes self-management. Patient education plan is to attend individual and/or group sessions per assessed needs and concerns.   Plan:   Patient Instructions  Be sure to nourish your body.  Lunch and dinner daily  Nutrition quality  Increase your vegetable intake  Snack or light meal options:  Fruit and nuts  Vegetables, whole grain crackers, hummus  Salad with edemame or marinated tofu  Soup with edemame or tofu and vegetables  Get back to the gym or being active in general.    Expected Outcomes:  Demonstrated interest in learning. Expect positive outcomes  Education material provided:   If problems or questions, patient to contact team via:  Phone  Future DSME appointment: PRN

## 2022-05-26 ENCOUNTER — Ambulatory Visit: Payer: PRIVATE HEALTH INSURANCE | Attending: Cardiovascular Disease

## 2022-05-26 DIAGNOSIS — Z79899 Other long term (current) drug therapy: Secondary | ICD-10-CM

## 2022-05-27 LAB — ALT: ALT: 14 IU/L (ref 0–44)

## 2022-05-27 LAB — LIPID PANEL
Chol/HDL Ratio: 5 ratio (ref 0.0–5.0)
Cholesterol, Total: 186 mg/dL (ref 100–199)
HDL: 37 mg/dL — ABNORMAL LOW
LDL Chol Calc (NIH): 130 mg/dL — ABNORMAL HIGH (ref 0–99)
Triglycerides: 103 mg/dL (ref 0–149)
VLDL Cholesterol Cal: 19 mg/dL (ref 5–40)

## 2022-06-27 ENCOUNTER — Encounter: Payer: Self-pay | Admitting: Medical

## 2022-06-30 ENCOUNTER — Telehealth: Payer: Self-pay | Admitting: Medical

## 2022-06-30 NOTE — Telephone Encounter (Signed)
Please assist patient with scheduling, thanks. 

## 2022-06-30 NOTE — Telephone Encounter (Signed)
Yes okay 

## 2022-06-30 NOTE — Telephone Encounter (Signed)
Request transfer to another [facility] PCP  Pt is requesting to transfer FROM:   [place]  Pt is requesting to transfer TO: [facility]  Reason for requested transfer: Dr. No longer in network with [employer] contact number: [phone]

## 2022-07-01 ENCOUNTER — Ambulatory Visit: Payer: PRIVATE HEALTH INSURANCE | Admitting: Medical

## 2022-07-02 NOTE — Telephone Encounter (Signed)
Please advise old ENT no longer practicing

## 2022-07-07 ENCOUNTER — Ambulatory Visit (INDEPENDENT_AMBULATORY_CARE_PROVIDER_SITE_OTHER): Payer: PRIVATE HEALTH INSURANCE | Admitting: Medical

## 2022-07-07 VITALS — BP 117/74 | HR 76 | Temp 98.6°F | Resp 18 | Ht 70.0 in | Wt 172.6 lb

## 2022-07-07 DIAGNOSIS — H9202 Otalgia, left ear: Secondary | ICD-10-CM | POA: Diagnosis not present

## 2022-07-07 DIAGNOSIS — H6692 Otitis media, unspecified, left ear: Secondary | ICD-10-CM

## 2022-07-07 DIAGNOSIS — S46812A Strain of other muscles, fascia and tendons at shoulder and upper arm level, left arm, initial encounter: Secondary | ICD-10-CM

## 2022-07-07 MED ORDER — FLUTICASONE PROPIONATE 50 MCG/ACT NA SUSP: 2.0000 | Freq: Every day | NASAL | 1 refills | Status: DC

## 2022-07-07 MED ORDER — CYCLOBENZAPRINE HCL 10 MG PO TABS: ORAL_TABLET | ORAL | 0 refills | Status: DC

## 2022-07-07 MED ORDER — ALBUTEROL SULFATE HFA 108 (90 BASE) MCG/ACT IN AERS: 2.0000 | INHALATION_SPRAY | Freq: Four times a day (QID) | RESPIRATORY_TRACT | 0 refills | Status: AC | PRN

## 2022-07-07 MED ORDER — AMOXICILLIN-POT CLAVULANATE 875-125 MG PO TABS: 1.0000 | ORAL_TABLET | Freq: Two times a day (BID) | ORAL | 0 refills | Status: DC

## 2022-07-07 MED ORDER — NEOMYCIN-POLYMYXIN-HC 3.5-10000-1 OT SOLN: 3.0000 [drp] | Freq: Four times a day (QID) | OTIC | 0 refills | Status: DC

## 2022-07-07 NOTE — Patient Instructions (Addendum)
Left ear TM by exam looks red. Some discomfort since November. Considering OM, OE with some eustachian tube dysfunction. Rx aumgentin antibiotic, flonase nasal spray and cortisporin otic drops.  For wheezing/exposure to cats refilled your albuterol inhaler.  Left side neck pain appears to be in trapezius are. Not behind the ear/in mastoid bone. If you were to get pain behind the ear let me know. In that event consider injection antibitotic and imaging studies.  Pain in neck seems more trapezius muscle pain. Will rx flexeril muscle relaxant.  Follow up in 7-10 days or sooner if needed

## 2022-07-07 NOTE — Progress Notes (Signed)
Subjective:    Patient ID: Darrell Frank, male    DOB: December 19, 1965, 57 y.o.   MRN: [id number]  HPI  Pt in for evaluation. Pt states he clarifies that he can see me per insurance. This office is still in network.  Pt states feels like has some ear discomfort since coming back from November. He states that pressure/pain fluid sensation to his ear. These symptoms did decrease but recently when got left side neck ache he though maybe lymph nodes related to ear infection.    Pt mentions that he wants to see ENT.  Hx of wheezing and allergic to cat. He has a cat and will wheeze at times. He needs new inhaler.  Review of Systems  Constitutional:  Negative for chills, fatigue and fever.  HENT:  Positive for ear pain. Negative for congestion.   Respiratory:  Negative for cough, chest tightness, shortness of breath and wheezing.   Cardiovascular:  Negative for chest pain and palpitations.  Gastrointestinal:  Negative for abdominal pain and blood in stool.  Musculoskeletal:  Negative for back pain, joint swelling and myalgias.  Skin:  Negative for rash.  Neurological:  Negative for dizziness, seizures, syncope, weakness and headaches.  Hematological:  Negative for adenopathy. Does not bruise/bleed easily.  Psychiatric/Behavioral:  Negative for behavioral problems and confusion.     Past Medical History:  Diagnosis Date   Asthma    Diabetes mellitus without complication ([place])    Hyperlipidemia    Hypertension    Kidney stone    Wheezing      Social History   Socioeconomic History   Marital status: Married    Spouse name: Not on file   Number of children: Not on file   Years of education: Not on file   Highest education level: Not on file  Occupational History   Occupation: Writer.  Tobacco Use   Smoking status: Former    Packs/day: 0.25    Years: 10.00    Total pack years: 2.50    Types: Cigarettes    Quit date: 12/05/2003    Years since quitting: 18.6    Smokeless tobacco: Never  Vaping Use   Vaping Use: Never used  Substance and Sexual Activity   Alcohol use: Not on file    Comment: minimal alcohol use/rare.   Drug use: Never   Sexual activity: Yes  Other Topics Concern   Not on file  Social History Narrative   Not on file   Social Determinants of Health   Financial Resource Strain: Not on file  Food Insecurity: Not on file  Transportation Needs: Not on file  Physical Activity: Not on file  Stress: Not on file  Social Connections: Not on file  Intimate Partner Violence: Not on file    Past Surgical History:  Procedure Laterality Date   CHOLECYSTECTOMY      Family History  Problem Relation Age of Onset   Dementia Mother    Stroke Father     No Known Allergies  Current Outpatient Medications on File Prior to Visit  Medication Sig Dispense Refill   Ascorbic Acid (VITAMIN C) 100 MG tablet Take 100 mg by mouth daily.     aspirin EC 81 MG tablet Take 1 tablet (81 mg total) by mouth daily. Swallow whole. (Patient not taking: Reported on 03/21/2022) 90 tablet 3   b complex vitamins capsule Take 1 capsule by mouth daily.     co-enzyme Q-10 30 MG capsule Take 30  mg by mouth 3 (three) times daily.     lisinopril (ZESTRIL) 10 MG tablet Take 1 tablet (10 mg total) by mouth daily. 30 tablet 11   metoprolol tartrate (LOPRESSOR) 100 MG tablet Take one tablet by mouth 2 hours prior to CT (Patient not taking: Reported on 03/21/2022) 1 tablet 0   Multiple Vitamin (MULTIVITAMIN) capsule Take by mouth.     Omega-3 Fatty Acids (FISH OIL) 1000 MG CAPS Take by mouth.     rosuvastatin (CRESTOR) 20 MG tablet Take 1 tablet (20 mg total) by mouth daily. (Patient not taking: Reported on 05/22/2022) 90 tablet 3   No current facility-administered medications on file prior to visit.    BP 117/74   Pulse 76   Temp 98.6 F (37 C)   Resp 18   Ht 5\' 10"  (1.778 m)   Wt 172 lb 9.6 oz (78.3 kg)   SpO2 98%   BMI 24.77 kg/m         Objective:   Physical Exam  General- No acute distress. Pleasant patient. Neck- Full range of motion, no jvd Lungs- Clear, even and unlabored. Heart- regular rate and rhythm. Neurologic- CNII- XII grossly intact.  Heent- mild red tm, with red canal appears mild swollen. No posterior auricular pain.     Assessment & Plan:   Patient Instructions  Left ear TM by exam looks red. Some discomfort since November. Considering OM, OE with some eustachian tube dysfunction. Rx aumgentin antibiotic, flonase nasal spray and cortisporin otic drops.  For wheezing/exposure to cats refilled your albuterol inhaler.  Left side neck pain appears to be in trapezius are. Not behind the ear/in mastoid bone. If you were to get pain behind the ear let me know. In that event consider injection antibitotic and imaging studies.  Pain in neck seems more trapezius muscle pain. Will rx flexeril muscle relaxant.  Follow up in 7-10 days or sooner if needed   [employer]

## 2022-07-10 ENCOUNTER — Ambulatory Visit: Payer: PRIVATE HEALTH INSURANCE | Admitting: Cardiovascular Disease

## 2022-07-10 ENCOUNTER — Encounter: Payer: Self-pay | Admitting: Cardiovascular Disease

## 2022-07-10 NOTE — Progress Notes (Signed)
This encounter was created in error - please disregard.

## 2022-07-22 DIAGNOSIS — H6993 Unspecified Eustachian tube disorder, bilateral: Secondary | ICD-10-CM | POA: Insufficient documentation

## 2022-10-06 ENCOUNTER — Encounter: Payer: Self-pay | Admitting: *Deleted

## 2022-11-04 ENCOUNTER — Ambulatory Visit: Payer: PRIVATE HEALTH INSURANCE | Admitting: Medical

## 2022-11-04 ENCOUNTER — Encounter: Payer: Self-pay | Admitting: Medical

## 2022-11-04 VITALS — BP 122/80 | HR 82 | Resp 18 | Ht 70.0 in | Wt 170.8 lb

## 2022-11-04 DIAGNOSIS — R739 Hyperglycemia, unspecified: Secondary | ICD-10-CM

## 2022-11-04 DIAGNOSIS — Z8709 Personal history of other diseases of the respiratory system: Secondary | ICD-10-CM

## 2022-11-04 DIAGNOSIS — I10 Essential (primary) hypertension: Secondary | ICD-10-CM | POA: Diagnosis not present

## 2022-11-04 DIAGNOSIS — R360 Urethral discharge without blood: Secondary | ICD-10-CM

## 2022-11-04 DIAGNOSIS — H6690 Otitis media, unspecified, unspecified ear: Secondary | ICD-10-CM

## 2022-11-04 DIAGNOSIS — Z1211 Encounter for screening for malignant neoplasm of colon: Secondary | ICD-10-CM

## 2022-11-04 DIAGNOSIS — H938X2 Other specified disorders of left ear: Secondary | ICD-10-CM

## 2022-11-04 MED ORDER — AMOXICILLIN-POT CLAVULANATE 875-125 MG PO TABS: 1.0000 | ORAL_TABLET | Freq: Two times a day (BID) | ORAL | 0 refills | Status: DC

## 2022-11-04 NOTE — Progress Notes (Signed)
Subjective:    Patient ID: Darrell Frank, male    DOB: [birth year], 57 y.o.   MRN: [id number]  HPI  Pt in with some left ear decreased hearing that started around 3 weeks ago after swimming in [place]. He states had runny nose and ear popping to left ear. Since then has had pressure to his ear.    Back in January referred to ENT for  Referred for Pt has left ear discomfort since November. Pt tm looks mild red today. He wants referral.   Assessment and Plan Problem List   Respiratory  Nasal polyp  Deviated nasal septum - Primary  Hypertrophy of inferior nasal turbinate   Nervous and Auditory  Dysfunction of both eustachian tubes   Other  Decreased sense of smell   PPE: Surgical mask worn by provider and CMA. Masks worn by patient and any of their visitors.   Months of left ear feeling full and then started having trouble turning his head to the left. [place] was [place] and so just recently received treatment with antibiotic and steroids. He has greatly improved. Now his hearing is opened up. He has some nasal congestion. He has trouble breathing. He has history of septal deviation. Has a history of polyps and CT showed one possibly in the left superior nasal cavity. He has only 10% of his smell.   CT Maxillofacial WO CM  Anatomical Region Laterality Modality  Head -- Computed Tomography   Narrative  CLINICAL DATA:  Maxillofacial pain.  Chronic sinusitis.   EXAM:  CT MAXILLOFACIAL WITHOUT CONTRAST   TECHNIQUE:  Multidetector CT images of the paranasal sinuses were obtained using  the standard protocol without intravenous contrast.   COMPARISON:  None.   FINDINGS:  Paranasal sinuses:   Frontal: Mucosal edema in the frontal sinus recess bilaterally right  greater than left. Occlusion of the right frontal sinus ostium.   Ethmoid: Moderate mucosal edema bilaterally   Maxillary: Mild mucosal edema bilaterally   Sphenoid: Normally aerated. Patent sphenoethmoidal  recesses.   Mastoid: Right mastoid and middle ear clear. 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 No opacification. Left middle ear clear.   Right ostiomeatal unit: Occluded   Left ostiomeatal unit: Occluded   Nasal passages: Possible polyp in the left superior nasal cavity.  Moderate to extensive septal deviation to the right.   Anatomy: Keros type 2 olfactory recess   No acute skeletal abnormality   Limited intracranial imaging negative   Bilateral cataract extraction.   IMPRESSION:  Occlusion of the ostiomeatal complex bilaterally. Occlusion of the  right frontal sinus ostium. 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    On: 04/15 2022 14:05  Exam: bilateral TMs are clear and mobile. Right deviated septum. No lesions or purulence in nasal passages. OP is clear.   Plan: we discussed that his ears look good and he is basically resolved from his ear symptoms. He could use Flonase as a trial for his nose but given his deviated septum he may not improve. He may want to discuss surgical plan with Dr. Christell Constant. He will call if he would like to pursue this in the near future.   HPI Chief Complaint  Patient presents with   Ear Fullness  Is being treated for ear  infection and it is getting better. Also is having sinus issues.    HPI Chief Complaint  Patient presents with   Ear Fullness  Is being treated for ear infection and it is getting better. Also is having sinus issues.    Pt wants to see new ENT that is in network.    High cholesterol.  Will repeat lipid panel today. On crestor presently.  Htn- bp controlled. Lisinopril 10 mg daily.   Review of Systems  Constitutional:  Negative for chills, fatigue and fever.  HENT:  Positive for hearing loss. Negative for sneezing and  sore throat.        Pressure left ear. Some decreased hearing.  Respiratory:  Negative for cough, choking and wheezing.   Cardiovascular:  Negative for chest pain and palpitations.  Gastrointestinal:  Negative for abdominal pain and blood in stool.  Genitourinary:  Negative for dysuria, flank pain and frequency.  Musculoskeletal:  Negative for back pain, myalgias and neck stiffness.  Skin:  Negative for rash.  Neurological:  Negative for dizziness, speech difficulty, weakness, numbness and headaches.  Hematological:  Negative for adenopathy. Does not bruise/bleed easily.  Psychiatric/Behavioral:  Negative for behavioral problems, confusion and decreased concentration.    Past Medical History:  Diagnosis Date   Asthma    Diabetes mellitus without complication (HCC)    Hyperlipidemia    Hypertension    Kidney stone    Wheezing      Social History   Socioeconomic History   Marital status: Married    Spouse name: Not on file   Number of children: Not on file   Years of education: Not on file   Highest education level: Associate degree: occupational, Scientist, product/process development, or vocational program  Occupational History   Occupation: Ecologist.  Tobacco Use   Smoking status: Former    Packs/day: 0.25    Years: 10.00    Additional pack years: 0.00    Total pack years: 2.50    Types: Cigarettes    Quit date: 12/05/2003    Years since quitting: 18.9   Smokeless tobacco: Never  Vaping Use   Vaping Use: Never used  Substance and Sexual Activity   Alcohol use: Not on file    Comment: minimal alcohol use/rare.   Drug use: Never   Sexual activity: Yes  Other Topics Concern   Not on file  Social History Narrative   Not on file   Social Determinants of Health   Financial Resource Strain: Low Risk  (10/30/2022)   Overall Financial Resource Strain (CARDIA)    Difficulty of Paying Living Expenses: Not very hard  Food Insecurity: No Food Insecurity (10/30/2022)   Hunger Vital  Sign    Worried About Running Out of Food in the Last Year: Never true    Ran Out of Food in the Last Year: Never true  Transportation Needs: No Transportation Needs (10/30/2022)   PRAPARE - Administrator, Civil Service (Medical): No    Lack of Transportation (Non-Medical): No  Physical Activity: Sufficiently Active (10/30/2022)   Exercise Vital Sign    Days of Exercise per Week: 3 days    Minutes of Exercise per Session: 50 min  Stress: No Stress Concern Present (10/30/2022)   [employer] of Occupational Health - Occupational Stress Questionnaire    Feeling of Stress : Only a little  Social Connections: Moderately Isolated (10/30/2022)   Social Connection and Isolation Panel [NHANES]    Frequency of Communication with  Friends and Family: Twice a week    Frequency of Social Gatherings with Friends and Family: Once a week    Attends Religious Services: Never    Database administrator or Organizations: No    Attends Engineer, structural: Not on file    Marital Status: Married  Catering manager Violence: Not on file    Past Surgical History:  Procedure Laterality Date   CHOLECYSTECTOMY      Family History  Problem Relation Age of Onset   Dementia Mother    Stroke Father     No Known Allergies  Current Outpatient Medications on File Prior to Visit  Medication Sig Dispense Refill   albuterol (VENTOLIN HFA) 108 (90 Base) MCG/ACT inhaler Inhale 2 puffs into the lungs every 6 (six) hours as needed. 18 g 0   Ascorbic Acid (VITAMIN C) 100 MG tablet Take 100 mg by mouth daily.     b complex vitamins capsule Take 1 capsule by mouth daily.     co-enzyme Q-10 30 MG capsule Take 30 mg by mouth 3 (three) times daily.     lisinopril (ZESTRIL) 10 MG tablet Take 1 tablet (10 mg total) by mouth daily. 30 tablet 11   Multiple Vitamin (MULTIVITAMIN) capsule Take by mouth.     neomycin-polymyxin-hydrocortisone (CORTISPORIN) OTIC solution Place 3 drops into the left ear 4  (four) times daily. 10 mL 0   Omega-3 Fatty Acids (FISH OIL) 1000 MG CAPS Take by mouth.     No current facility-administered medications on file prior to visit.    BP 122/80   Pulse 82   Resp 18   Ht 5\' 10"  (1.778 m)   Wt 170 lb 12.8 oz (77.5 kg)   SpO2 97%   BMI 24.51 kg/m         Objective:   Physical Exam  General Mental Status- Alert. General Appearance- Not in acute distress.   Skin General: Color- Normal Color. Moisture- Normal Moisture.  Neck Carotid Arteries- Normal color. Moisture- Normal Moisture. No carotid bruits. No JVD.  Chest and Lung Exam Auscultation: Breath Sounds:-Normal.  Cardiovascular Auscultation:Rythm- Regular. Murmurs & Other Heart Sounds:Auscultation of the heart reveals- No Murmurs.  Abdomen Inspection:-Inspeection Normal. Palpation/Percussion:Note:No mass. Palpation and Percussion of the abdomen reveal- Non Tender, Non Distended + BS, no rebound or guarding.   Neurologic Cranial Nerve exam:- CN III-XII intact(No nystagmus), symmetric smile. Strength:- 5/5 equal and symmetric strength both upper and lower extremities.   Heent- no sinus pressure. Nose- deviated septum. Left ear tm mild redness. No tragal tenderness. No mastoid tendernss.     Assessment & Plan:   Patient Instructions  1. Elevated blood sugar Advise low sugar diet and check sugar average today. - Hemoglobin A1c  2. Hypertension, unspecified type Bp well controlled. Continue lisinopril - Comp Met (CMET) - Lipid panel  3. Screening for colon cancer Call them directly and see if they will schedule you. Try to sign release of information with office that referred you to prior GI MD.  - Ambulatory referral to Gastroenterology  4. Pressure sensation in left ear Restart flonase. - Ambulatory referral to ENT  5. Ear infection Augmentin antibiotic.  6. History of deviated nasal septum - Ambulatory referral to ENT   Follow up in 2 weeks or sooner if needed.     Esperanza Richters, PA-C

## 2022-11-04 NOTE — Addendum Note (Signed)
Addended by: Gwenevere Abbot on: 11/04/2022 03:01 PM   Modules accepted: Orders

## 2022-11-04 NOTE — Patient Instructions (Addendum)
1. Elevated blood sugar Advise low sugar diet and check sugar average today. - Hemoglobin A1c  2. Hypertension, unspecified type Bp well controlled. Continue lisinopril - Comp Met (CMET) - Lipid panel  3. Screening for colon cancer Call them directly and see if they will schedule you. Try to sign release of information with office that referred you to prior GI MD.  - Ambulatory referral to Gastroenterology  4. Pressure sensation in left ear Restart flonase. - Ambulatory referral to ENT  5. Ear infection Augmentin antibiotic.  6. History of deviated nasal septum - Ambulatory referral to ENT   Follow up in 2 weeks or sooner if needed.

## 2022-11-10 ENCOUNTER — Telehealth: Payer: Self-pay | Admitting: Gastroenterology

## 2022-11-10 NOTE — Telephone Encounter (Signed)
Good morning Dr. Barron Alvine,   Supervising Provider 11/10/22 AM   We received a referral for this patient to schedule a colonoscopy. Patient last had colonoscopy in 09/2017 with [facility]. Patient states he is being referred here and not [facility] and that is why he would like to schedule. Does not recall any issues or concerns with previous GI. Records were obtained and in Media under "[facility] Transfer of Care" for your review. Would you please review and advise on scheduling?    Thank you.

## 2024-03-31 ENCOUNTER — Telehealth: Payer: Self-pay | Admitting: Medical

## 2024-03-31 NOTE — Telephone Encounter (Signed)
 Called pt and informed Dr. Jodie isn't taking new patients. Patient then stated he was informed that an exception would be made. Please advise.    Copied from CRM [id number]. Topic: Appointments - Scheduling Inquiry for Clinic >> Mar 31, 2024 10:09 AM Emylou G wrote: Reason for CRM: Patient called.. wants to transfer to [facility] - He wants to be seen by Dr Jodie ( I do see you are not accepting patients for Dr Jodie? ) He wanted me to ask anyway - if he can be seen as a new patient?  Pls call patient.

## 2024-04-01 NOTE — Telephone Encounter (Signed)
 Please see message.

## 2024-04-15 NOTE — Telephone Encounter (Signed)
 Patient on schedule for 04/20/24.

## 2024-04-20 ENCOUNTER — Encounter: Payer: Self-pay | Admitting: Family Medicine

## 2024-04-20 ENCOUNTER — Ambulatory Visit (INDEPENDENT_AMBULATORY_CARE_PROVIDER_SITE_OTHER): Payer: PRIVATE HEALTH INSURANCE | Admitting: Family Medicine

## 2024-04-20 VITALS — BP 130/82 | HR 79 | Temp 98.1°F | Ht 70.0 in | Wt 177.8 lb

## 2024-04-20 DIAGNOSIS — Z125 Encounter for screening for malignant neoplasm of prostate: Secondary | ICD-10-CM

## 2024-04-20 DIAGNOSIS — I10 Essential (primary) hypertension: Secondary | ICD-10-CM

## 2024-04-20 DIAGNOSIS — Z0001 Encounter for general adult medical examination with abnormal findings: Secondary | ICD-10-CM | POA: Diagnosis not present

## 2024-04-20 DIAGNOSIS — E782 Mixed hyperlipidemia: Secondary | ICD-10-CM | POA: Diagnosis not present

## 2024-04-20 DIAGNOSIS — R7303 Prediabetes: Secondary | ICD-10-CM | POA: Diagnosis not present

## 2024-04-20 DIAGNOSIS — R39198 Other difficulties with micturition: Secondary | ICD-10-CM

## 2024-04-20 NOTE — Patient Instructions (Signed)
 Please return in 12 months for your annual complete physical; please come fasting.   I will release your lab results to you on your MyChart account with further instructions. You may see the results before I do, but when I review them I will send you a message with my report or have my assistant call you if things need to be discussed. Please reply to my message with any questions. Thank you!   If you have any questions or concerns, please don't hesitate to send me a message via MyChart or call the office at [phone]. Thank you for visiting with us  today! It's our pleasure caring for you.    VISIT SUMMARY: Today, you had a routine check-up to review your overall health and manage your existing conditions, including hypertension, prediabetes, and hyperlipidemia. We discussed your family history of Alzheimer's disease and prostate issues, and you expressed interest in comprehensive blood tests and preventive health measures.  YOUR PLAN: -ADULT WELLNESS VISIT: We conducted a comprehensive wellness evaluation and discussed your family history of Alzheimer's and prostate issues. You have not had recent blood work since 2023, so we will order comprehensive blood tests to assess your overall health, including cholesterol, kidney, liver, thyroid, blood counts, and testosterone  levels. Please schedule a fasting blood work appointment.  -ESSENTIAL HYPERTENSION: Hypertension is high blood pressure. Your blood pressure is generally well-controlled with enalapril 10 mg, but we discussed increasing the dose to 20 mg daily to achieve better control. Please monitor your blood pressure at home and adjust the medication if you experience lightheadedness.  -PREDIABETES: Prediabetes is a condition where blood sugar levels are higher than normal but not high enough to be classified as diabetes. We will include glucose levels in your comprehensive blood work to evaluate the need for further intervention based on the  results.  -MIXED HYPERLIPIDEMIA: Hyperlipidemia is having high levels of fats (lipids) in your blood. You have made significant lifestyle changes, and we will include a lipid panel in your blood work to assess your current cholesterol levels and determine if medication is needed.  -BENIGN PROSTATIC HYPERPLASIA WITH LOWER URINARY TRACT SYMPTOMS: Benign prostatic hyperplasia is an enlarged prostate gland that can cause urinary symptoms. We discussed including a PSA test in your blood work to screen for prostate cancer. Please monitor your urinary symptoms and consider medication if they worsen.  -NICOTINE DEPENDENCE, VAPING: Nicotine dependence from vaping can have serious health risks. You acknowledged the need to quit and expressed your intention to stop soon. We encourage you to cease vaping for your overall health.  -GENERAL HEALTH MAINTENANCE: We discussed the importance of vaccinations, including the shingles and pneumonia vaccines. The shingles vaccine is a two-shot series that can prevent a painful rash and complications. Please consider starting the shingles vaccination series and the pneumonia vaccination.  INSTRUCTIONS: Please follow up after your blood work results are available. Additionally, investigate your previous colonoscopy records to determine if you need a repeat procedure.                      Contains text generated by [employer]bridge.                                 Contains text generated by Abridge.

## 2024-04-21 ENCOUNTER — Encounter: Payer: Self-pay | Admitting: Family Medicine

## 2024-04-21 ENCOUNTER — Other Ambulatory Visit (INDEPENDENT_AMBULATORY_CARE_PROVIDER_SITE_OTHER): Payer: PRIVATE HEALTH INSURANCE

## 2024-04-21 DIAGNOSIS — I10 Essential (primary) hypertension: Secondary | ICD-10-CM

## 2024-04-21 DIAGNOSIS — Z125 Encounter for screening for malignant neoplasm of prostate: Secondary | ICD-10-CM

## 2024-04-21 DIAGNOSIS — E782 Mixed hyperlipidemia: Secondary | ICD-10-CM

## 2024-04-21 DIAGNOSIS — R7303 Prediabetes: Secondary | ICD-10-CM | POA: Diagnosis not present

## 2024-04-21 DIAGNOSIS — R39198 Other difficulties with micturition: Secondary | ICD-10-CM

## 2024-04-21 DIAGNOSIS — Z0001 Encounter for general adult medical examination with abnormal findings: Secondary | ICD-10-CM | POA: Diagnosis not present

## 2024-04-21 LAB — PSA: PSA: 1.14 ng/mL (ref 0.10–4.00)

## 2024-04-21 LAB — LIPID PANEL
Cholesterol: 175 mg/dL (ref 0–200)
HDL: 45.5 mg/dL (ref >39.00)
LDL Cholesterol: 113 mg/dL — ABNORMAL HIGH (ref 0–99)
NonHDL: 129.7
Total CHOL/HDL Ratio: 4
Triglycerides: 82 mg/dL (ref 0.0–149.0)
VLDL: 16.4 mg/dL (ref 0.0–40.0)

## 2024-04-21 LAB — COMPREHENSIVE METABOLIC PANEL WITH GFR
ALT: 17 U/L (ref 0–53)
AST: 21 U/L (ref 0–37)
Albumin: 4.4 g/dL (ref 3.5–5.2)
Alkaline Phosphatase: 66 U/L (ref 39–117)
BUN: 22 mg/dL (ref 6–23)
CO2: 29 meq/L (ref 19–32)
Calcium: 8.9 mg/dL (ref 8.4–10.5)
Chloride: 101 meq/L (ref 96–112)
Creatinine, Ser: 1.21 mg/dL (ref 0.40–1.50)
GFR: 66.53 mL/min (ref >60.00)
Glucose, Bld: 105 mg/dL — ABNORMAL HIGH (ref 70–99)
Potassium: 4.3 meq/L (ref 3.5–5.1)
Sodium: 137 meq/L (ref 135–145)
Total Bilirubin: 0.8 mg/dL (ref 0.2–1.2)
Total Protein: 6.9 g/dL (ref 6.0–8.3)

## 2024-04-21 LAB — CBC WITH DIFFERENTIAL/PLATELET
Basophils Absolute: 0 K/uL (ref 0.0–0.1)
Basophils Relative: 0.8 % (ref 0.0–3.0)
Eosinophils Absolute: 0.3 K/uL (ref 0.0–0.7)
Eosinophils Relative: 4.2 % (ref 0.0–5.0)
HCT: 45.2 % (ref 39.0–52.0)
Hemoglobin: 15.1 g/dL (ref 13.0–17.0)
Lymphocytes Relative: 35.5 % (ref 12.0–46.0)
Lymphs Abs: 2.2 K/uL (ref 0.7–4.0)
MCHC: 33.5 g/dL (ref 30.0–36.0)
MCV: 90.3 fl (ref 78.0–100.0)
Monocytes Absolute: 0.4 K/uL (ref 0.1–1.0)
Monocytes Relative: 6.3 % (ref 3.0–12.0)
Neutro Abs: 3.2 K/uL (ref 1.4–7.7)
Neutrophils Relative %: 53.2 % (ref 43.0–77.0)
Platelets: 261 K/uL (ref 150.0–400.0)
RBC: 5 Mil/uL (ref 4.22–5.81)
RDW: 12.9 % (ref 11.5–15.5)
WBC: 6.1 K/uL (ref 4.0–10.5)

## 2024-04-21 LAB — TSH: TSH: 1.27 u[IU]/mL (ref 0.35–5.50)

## 2024-04-21 LAB — HEMOGLOBIN A1C: Hgb A1c MFr Bld: 6.3 % (ref 4.6–6.5)

## 2024-04-21 NOTE — Progress Notes (Signed)
 Subjective  Chief Complaint  Patient presents with   Transitions Of Care    HPI: Darrell Frank is a 58 y.o. male who presents to [facility] [place] today for a Male Wellness Visit. He also has the concerns and/or needs as listed above in the chief complaint. These will be addressed in addition to the Health Maintenance Visit.   Wellness Visit: annual visit with health maintenance review and exam  HM: I reviewed records from past primary care doctors.  Chart reviewed reveals colonoscopy May 2019 through [facility].  Had in [facility].  But no report is available online.  Patient does recall history of polyps.  I do not see any pathology reports.  He reports that he was told to repeat testing in 5 years.  He has been referred to [facility] but they need old records.  Patient is unable to find old records at this time.  Otherwise average risk and no symptoms.  Healthy lifestyle now.  Is eating a healthy diet and exercising regularly.  Hoping this has improved his health.  Eligible for Shingrix vaccination and Prevnar 20.  Eligible for prostate cancer screening.  Eligible for flu vaccine.  This time he defers all vaccinations but will consider Shingrix and Prevnar in the future.  Education counseling given.  Chronic disease f/u and/or acute problem visit: (deemed necessary to be done in addition to the wellness visit): Discussed the use of AI scribe software for clinical note transcription with the patient, who gave verbal consent to proceed.  History of Present Illness Darrell Frank is a 58 year old male with hypertension and prediabetes who presents for a routine check-up and blood work.  Hypertension, ongoing and has been well-controlled. - Managed with enalapril 10 mg daily, obtained from [place] - Previously managed with lisinopril  - Blood pressure typically around 120/80 mmHg with medication - Blood pressure can rise to 150 mmHg if a dose is missed - No  chest pain or secondary complications.  Last blood work was done in 2023.  I reviewed  Prediabetes and glucose intolerance - Ongoing elevated blood sugars in the prediabetic range although there was 1 A1c at 6.7 several years back. - One prior glucose reading in diabetic range, but not started on medication - Consulted a nutritionist and endocrinologist several years ago - Dietary changes implemented over the last two years - Family history of diabetes on paternal side; uncle died from diabetes - No symptoms of hypoglycemia at this time.  Hyperlipidemia - History of elevated cholesterol - Briefly treated with medication, discontinued based on non-medical advice.  He had been on a statin.  Positive family history of hyperlipidemia but no premature coronary artery disease. - Focused on healthier lifestyle over the past 1.5 years in hopes of improving cholesterol levels  Lower urinary tract symptoms, likely BPH. - Decreased urinary flow - Occasional nocturia not bothersome - Family history of prostate enlargement (father), no prostate cancer - Interest in PSA screening  Neurocognitive risk - Concerned about risk for Alzheimer's disease - Family history of Alzheimer's disease in mother and maternal siblings - Interest in comprehensive blood tests to screen for various conditions, including cancer - Normal cognitive function subjectively.  Musculoskeletal symptoms - Joint issues, particularly in the knees - Active lifestyle with bodyweight exercises - Avoids weightlifting to prevent height loss  Would like his testosterone  checked.  Energy level is fairly good.  However would like to be sure his levels are normal.  Libido is good.  Preventive health and laboratory monitoring - No recent blood work since 2023 - Interested in checking cholesterol, kidney, liver, thyroid, and testosterone  levels - Considering shingles vaccination    Assessment  1. Encounter for well adult exam with  abnormal findings   2. Mixed hyperlipidemia   3. Prediabetes   4. Essential hypertension   5. Decreased urine stream   6. Screening for prostate cancer      Plan  Male Wellness Visit: Age appropriate Health Maintenance and Prevention measures were discussed with patient. Included topics are cancer screening recommendations, ways to keep healthy (see AVS) including dietary and exercise recommendations, regular eye and dental care, use of seat belts, and avoidance of moderate alcohol use and tobacco use.  Will recommend a colonoscopy if cannot find report of polyps.  PSA screen today with education BMI: discussed patient's BMI and encouraged positive lifestyle modifications to help get to or maintain a target BMI. HM needs and immunizations were addressed and ordered. See below for orders. See HM and immunization section for updates.  Defers vaccinations today but I do recommend Shingrix and Prevnar 20.  He does vape. Routine labs and screening tests ordered including cmp, cbc and lipids where appropriate. Discussed recommendations regarding Vit D and calcium  supplementation (see AVS)  Chronic disease management visit and/or acute problem visit: Assessment and Plan Assessment & Plan Adult Wellness Visit Comprehensive wellness evaluation conducted. Concerns about family history of Alzheimer's and prostate issues. No recent blood work since 2023. Interested in comprehensive blood tests to assess overall health, including cancer markers. No current symptoms suggestive of acute illness. - Order comprehensive blood work including cholesterol, kidney, liver, thyroid, blood counts, and testosterone  levels - Schedule fasting blood work appointment - Vaping cessation recommended.  Essential hypertension Long-standing hypertension managed with enalapril 10 mg. Blood pressure generally well-controlled with medication, but could be improved. Discussed potential benefits of increasing enalapril to 20 mg  to achieve optimal blood pressure control (target 110/70 mmHg). - Increase enalapril to 20 mg daily - Monitor blood pressure at home - Adjust medication if lightheadedness occurs - Check renal function electrolytes  Prediabetes Prediabetes with one past glucose reading in diabetic range. No current medication for diabetes. Significant lifestyle changes reported, including improved diet and weight loss. Family history of diabetes noted. - Include glucose levels in comprehensive blood work - Evaluate need for further intervention based on results - Check A1c and continue healthy diet  Mixed hyperlipidemia Previously on cholesterol medication, but discontinued based on advice from a friend. Significant lifestyle changes reported, including healthier eating habits. Interested in assessing current cholesterol levels. - Include lipid panel in comprehensive blood work - Evaluate need for medication based on results   Benign prostatic hyperplasia with lower urinary tract symptoms Reported symptoms of decreased urinary flow and nocturia. Discussed PSA test as a screening tool for prostate cancer, acknowledging its limitations. Symptoms not currently severe enough to warrant medication. - Include PSA test in comprehensive blood work - Monitor urinary symptoms and consider medication if symptoms worsen  Nicotine dependence, vaping Vaping for approximately one year. Acknowledged the need to quit and expressed intention to stop soon. Discussed health risks associated with vaping. - Encourage cessation of vaping  Screening for hypotestosterone in the fasting state.  Patient will return for blood work General Health Maintenance Discussed the importance of vaccinations, including shingles and pneumonia vaccines. Not currently up to date with shingles vaccine. Discussed the benefits of the shingles vaccine, including prevention of  painful rash and complications. Shingles is common, affecting 1 in 3  individuals, and the vaccine is a two-shot series providing effective protection. - Consider shingles vaccination series - Consider pneumonia vaccination starting at age 43  Follow-up Plans for follow-up based on blood work results and further evaluation of health status. - Follow up after blood work results are available - Investigate previous colonoscopy records to determine need for repeat procedure    Follow up: 1 year for complete physical Orders Placed This Encounter  Procedures   PSA   Testosterone ,Free and Total   CBC with Differential/Platelet   Comprehensive metabolic panel with GFR   Hemoglobin A1c   Lipid panel   TSH   No orders of the defined types were placed in this encounter.     Body mass index is 25.51 kg/m.  Wt Readings from Last 3 Encounters:  04/20/24 177 lb 12.8 oz (80.6 kg)  11/04/22 170 lb 12.8 oz (77.5 kg)  07/07/22 172 lb 9.6 oz (78.3 kg)     Patient Active Problem List   Diagnosis Date Noted   Dysfunction of both eustachian tubes 07/22/2022   Hyperlipidemia 01/28/2022   Cervical radiculopathy 08/23/2020   Deviated nasal septum 01/20/2020   Nasal polyp 10/14/2017    Last Assessment & Plan:   Concern over nasal obstruction.  See history of present illness.  EXAM by anterior rhinoscopy shows a pretty severe right-sided nasal septal deformity with bilateral inferior turbinate hypertrophy.  No obvious polyps or purulence.  NASAL ENDOSCOPY shows a clear inferior meatus bilaterally but there are polyps in the middle meatus and medial to the middle turbinate bilaterally.  Nasopharynx is clear.  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  He also has some structural issues with a severe right-sided nasal septal deformity as well as inferior turbinate hypertrophy.  We will get a CT scan to determine the severity of his nasal polyp disease.  Further plan based on those results.    Essential hypertension  07/27/2017   Personal history of kidney stones 07/27/2017   Health Maintenance  Topic Date Due   Hepatitis C Screening  Never done   Hepatitis B Vaccines 19-59 Average Risk (1 of 3 - 19+ 3-dose series) Never done   COVID-19 Vaccine (4 - 2025-26 season) 05/06/2024 (Originally 02/22/2024)   Zoster Vaccines- Shingrix (1 of 2) 07/21/2024 (Originally [birth year]06/1985)   Influenza Vaccine  09/20/2024 (Originally 01/22/2024)   Pneumococcal Vaccine: 50+ Years (1 of 1 - PCV) 04/20/2025 (Originally 12/06/2016)   DTaP/Tdap/Td (2 - Td or Tdap) 09/29/2027   Colonoscopy  11/18/2027   HIV Screening  Completed   HPV VACCINES  Aged Out   Meningococcal B Vaccine  Aged Out   Immunization History  Administered Date(s) Administered   PFIZER(Purple Top)SARS-COV-2 Vaccination 08/08/2019, 09/26/2019, 06/11/2020   Tdap 09/28/2017   We updated and reviewed the patient's past history in detail and it is documented below. Allergies: Patient has no known allergies. Past Medical History Patient  has a past medical history of Asthma (2000), Hyperlipidemia, Hypertension (2020), Kidney stone, and Wheezing. Past Surgical History Patient  has a past surgical history that includes Cholecystectomy (2018) and Eye surgery (2017). Family History: Patient family history includes Dementia in his mother; Hypertension in his father; Stroke in his father. Social History:  Patient  reports that he quit smoking about 20 years ago. His smoking use included cigarettes. He started smoking about 30 years ago. He has a 2.5 pack-year smoking history. He  has never used smokeless tobacco. He reports current alcohol use. He reports that he does not use drugs.  Review of Systems: Constitutional: negative for fever or malaise Ophthalmic: negative for photophobia, double vision or loss of vision Cardiovascular: negative for chest pain, dyspnea on exertion, or new LE swelling Respiratory: negative for SOB or persistent cough Gastrointestinal:  negative for abdominal pain, change in bowel habits or melena Genitourinary: negative for dysuria or gross hematuria, no abnormal uterine bleeding or disharge Musculoskeletal: negative for new gait disturbance or muscular weakness Integumentary: negative for new or persistent rashes, no breast lumps Neurological: negative for TIA or stroke symptoms Psychiatric: negative for SI or delusions Allergic/Immunologic: negative for hives  Patient Care Team    Relationship Specialty Notifications Start End  Jodie Lavern CROME, MD PCP - General Family Medicine  04/20/24   Nahser, Aleene PARAS, MD (Inactive) PCP - Cardiology Cardiology  01/28/22     Objective . Vitals: BP 130/82   Pulse 79   Temp 98.1 F (36.7 C)   Ht 5' 10 (1.778 m)   Wt 177 lb 12.8 oz (80.6 kg)   SpO2 96%   BMI 25.51 kg/m  General:  Well developed, well nourished, no acute distress  Psych:  Alert and orientedx3,normal mood and affect HEENT:  Normocephalic, atraumatic, non-icteric sclera,  supple neck without adenopathy, mass or thyromegaly Cardiovascular:  Normal S1, S2, RRR without gallop, rub or murmur Respiratory:  Good breath sounds bilaterally, CTAB with normal respiratory effort Gastrointestinal: normal bowel sounds, soft, non-tender, no noted masses. No HSM MSK: extremities without edema, joints without erythema or swelling, good muscle tone and bulk Neurologic:    Mental status is normal.  Gross motor and sensory exams are normal.  No tremor  Commons side effects, risks, benefits, and alternatives for medications and treatment plan prescribed today were discussed, and the patient expressed understanding of the given instructions. Patient is instructed to call or message via MyChart if he/she has any questions or concerns regarding our treatment plan. No barriers to understanding were identified. We discussed Red Flag symptoms and signs in detail. Patient expressed understanding regarding what to do in case of urgent or  emergency type symptoms.  Medication list was reconciled, printed and provided to the patient in AVS. Patient instructions and summary information was reviewed with the patient as documented in the AVS. This note was prepared with assistance of Dragon voice recognition software. Occasional wrong-word or sound-a-like substitutions may have occurred due to the inherent limitations of voice recognition software

## 2024-04-23 LAB — TESTOSTERONE,FREE AND TOTAL
Testosterone, Free: 8.9 pg/mL (ref 7.2–24.0)
Testosterone: 248 ng/dL — ABNORMAL LOW (ref 264–916)

## 2024-04-25 ENCOUNTER — Ambulatory Visit: Payer: Self-pay | Admitting: Family Medicine

## 2024-04-25 NOTE — Progress Notes (Signed)
 Labs reviewed.  The 10-year ASCVD risk score (Arnett DK, et al., 2019) is: 7.5%   Values used to calculate the score:     Age: 58 years     Clincally relevant sex: Male     Is Non-Hispanic African American: No     Diabetic: No     Tobacco smoker: No     Systolic Blood Pressure: 130 mmHg     Is BP treated: Yes     HDL Cholesterol: 45.5 mg/dL     Total Cholesterol: 175 mg/dL

## 2024-04-28 ENCOUNTER — Other Ambulatory Visit: Payer: Self-pay | Admitting: Family Medicine

## 2024-04-28 MED ORDER — AZITHROMYCIN 250 MG PO TABS
ORAL_TABLET | ORAL | 0 refills | Status: AC
Start: 2024-04-28 — End: ?

## 2024-04-28 NOTE — Telephone Encounter (Signed)
 Pt with productive cough ... Requesting zpak.
